# Patient Record
Sex: Male | Born: 1935
Health system: Southern US, Community
[De-identification: ages and names within clinical notes are randomized; demographics above are authoritative.]

## PROBLEM LIST (undated history)

## (undated) DIAGNOSIS — N189 Chronic kidney disease, unspecified: Secondary | ICD-10-CM

## (undated) DIAGNOSIS — C801 Malignant (primary) neoplasm, unspecified: Secondary | ICD-10-CM

## (undated) DIAGNOSIS — M1990 Unspecified osteoarthritis, unspecified site: Secondary | ICD-10-CM

## (undated) HISTORY — DX: Malignant (primary) neoplasm, unspecified: C80.1

## (undated) HISTORY — PX: PROSTATE SURGERY: SHX751

## (undated) HISTORY — DX: Chronic kidney disease, unspecified: N18.9

## (undated) HISTORY — PX: CRANIECTOMY FOR EXCISION OF ACOUSTIC NEUROMA: SUR324

## (undated) HISTORY — DX: Unspecified osteoarthritis, unspecified site: M19.90

---

## 2009-06-11 ENCOUNTER — Emergency Department (HOSPITAL_COMMUNITY): Admission: EM | Admit: 2009-06-11 | Discharge: 2009-06-12 | Payer: Self-pay | Admitting: Emergency Medicine

## 2010-10-20 ENCOUNTER — Ambulatory Visit: Admission: RE | Admit: 2010-10-20 | Payer: Self-pay | Source: Home / Self Care | Admitting: General Surgery

## 2011-06-07 ENCOUNTER — Ambulatory Visit (INDEPENDENT_AMBULATORY_CARE_PROVIDER_SITE_OTHER): Payer: Medicare Other | Admitting: Internal Medicine

## 2011-06-07 ENCOUNTER — Encounter: Payer: Self-pay | Admitting: Internal Medicine

## 2011-06-07 VITALS — BP 130/84 | HR 90 | Temp 98.3°F | Resp 18 | Ht 73.5 in | Wt 218.0 lb

## 2011-06-07 DIAGNOSIS — G479 Sleep disorder, unspecified: Secondary | ICD-10-CM

## 2011-06-07 DIAGNOSIS — Z0000 Encounter for general adult medical examination without abnormal findings: Secondary | ICD-10-CM

## 2011-06-07 DIAGNOSIS — Z125 Encounter for screening for malignant neoplasm of prostate: Secondary | ICD-10-CM

## 2011-06-07 DIAGNOSIS — C61 Malignant neoplasm of prostate: Secondary | ICD-10-CM

## 2011-06-07 DIAGNOSIS — Z79899 Other long term (current) drug therapy: Secondary | ICD-10-CM

## 2011-06-07 DIAGNOSIS — K219 Gastro-esophageal reflux disease without esophagitis: Secondary | ICD-10-CM

## 2011-06-07 DIAGNOSIS — Z860100 Personal history of colon polyps, unspecified: Secondary | ICD-10-CM | POA: Insufficient documentation

## 2011-06-07 DIAGNOSIS — Z23 Encounter for immunization: Secondary | ICD-10-CM

## 2011-06-07 LAB — CBC WITH DIFFERENTIAL/PLATELET
Hemoglobin: 14.3 g/dL (ref 13.0–17.0)
Lymphs Abs: 2 10*3/uL (ref 0.7–4.0)
MCHC: 33.1 g/dL (ref 30.0–36.0)
Monocytes Absolute: 0.7 10*3/uL (ref 0.1–1.0)
Monocytes Relative: 7.9 % (ref 3.0–12.0)
RDW: 13.6 % (ref 11.5–14.6)
WBC: 8.6 10*3/uL (ref 4.5–10.5)

## 2011-06-07 LAB — TSH: TSH: 2.91 u[IU]/mL (ref 0.35–5.50)

## 2011-06-07 LAB — COMPREHENSIVE METABOLIC PANEL
AST: 18 U/L (ref 0–37)
Albumin: 4.1 g/dL (ref 3.5–5.2)
Alkaline Phosphatase: 54 U/L (ref 39–117)
CO2: 28 mEq/L (ref 19–32)
Calcium: 10 mg/dL (ref 8.4–10.5)
Creatinine, Ser: 1.2 mg/dL (ref 0.4–1.5)
GFR: 62.46 mL/min (ref >60.00)
Glucose, Bld: 85 mg/dL (ref 70–99)
Total Bilirubin: 0.6 mg/dL (ref 0.3–1.2)

## 2011-06-07 LAB — CHOLESTEROL, TOTAL: Cholesterol: 247 mg/dL — ABNORMAL HIGH (ref 0–200)

## 2011-06-07 LAB — PSA: PSA: 0.05 ng/mL — ABNORMAL LOW (ref 0.10–4.00)

## 2011-06-07 MED ORDER — ESZOPICLONE 3 MG PO TABS: 3.0000 mg | ORAL_TABLET | Freq: Every day | ORAL | Status: DC
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
## 2011-06-07 NOTE — Patient Instructions (Signed)
Limit your sodium (Salt) intake    It is important that you exercise regularly, at least 20 minutes 3 to 4 times per week.  If you develop chest pain or shortness of breath seek  medical attention.  Return in one year for follow-up   

## 2011-10-10 ENCOUNTER — Ambulatory Visit (INDEPENDENT_AMBULATORY_CARE_PROVIDER_SITE_OTHER): Payer: Medicare Other | Admitting: Internal Medicine

## 2011-10-10 ENCOUNTER — Encounter: Payer: Self-pay | Admitting: Internal Medicine

## 2011-10-10 DIAGNOSIS — C61 Malignant neoplasm of prostate: Secondary | ICD-10-CM

## 2011-10-10 DIAGNOSIS — M7071 Other bursitis of hip, right hip: Secondary | ICD-10-CM

## 2011-10-10 DIAGNOSIS — M76899 Other specified enthesopathies of unspecified lower limb, excluding foot: Secondary | ICD-10-CM

## 2011-10-10 MED ORDER — METHYLPREDNISOLONE ACETATE 80 MG/ML IJ SUSP
80.0000 mg | Freq: Once | INTRAMUSCULAR | Status: AC
  Administered 2011-10-10: 80 mg via INTRAMUSCULAR

## 2011-10-10 NOTE — Progress Notes (Signed)
  Subjective:    [name], male    DOB: December 15, 1928, 78 y.o.   MRN: [id number]  HPI  78 year old patient who presents today with a chief complaint of right hip pain this has been present for approximately 6 weeks pain is bothersome at night but remains asymptomatic during the day even with activities. He usually walks 40 minutes a day and this causes no hip discomfort. He states he has had some urinary frequency since a prostatectomy but this has worsened recently also interfered with sleep. He does have some chronic insomnia issues and takes Lunesta once or twice weekly    Review of Systems  Constitutional: Negative for fever, chills, appetite change and fatigue.  HENT: Negative for hearing loss, ear pain, congestion, sore throat, trouble swallowing, neck stiffness, dental problem, voice change and tinnitus.   Eyes: Negative for pain, discharge and visual disturbance.  Respiratory: Negative for cough, chest tightness, wheezing and stridor.   Cardiovascular: Negative for chest pain, palpitations and leg swelling.  Gastrointestinal: Negative for nausea, vomiting, abdominal pain, diarrhea, constipation, blood in stool and abdominal distention.  Genitourinary: Positive for frequency. Negative for urgency, hematuria, flank pain, discharge, difficulty urinating and genital sores.  Musculoskeletal: Positive for arthralgias. Negative for myalgias, back pain, joint swelling and gait problem.  Skin: Negative for rash.  Neurological: Negative for dizziness, syncope, speech difficulty, weakness, numbness and headaches.  Hematological: Negative for adenopathy. Does not bruise/bleed easily.  Psychiatric/Behavioral: Negative for behavioral problems and dysphoric mood. The patient is not nervous/anxious.        Objective:   Physical Exam  Constitutional: He is oriented to person, place, and time. He appears well-developed.  HENT:  Head: Normocephalic.  Right Ear: External ear normal.  Left  Ear: External ear normal.  Eyes: Conjunctivae and EOM are normal.  Neck: Normal range of motion.  Cardiovascular: Normal rate and normal heart sounds.   Pulmonary/Chest: Breath sounds normal.  Abdominal: Bowel sounds are normal.  Musculoskeletal: Normal range of motion. He exhibits no edema and no tenderness.       No point tenderness over the right hip area range of motion right hip appear to be fairly well intact but  internal and external rotation did tend to reproduce his pain slightly  Neurological: He is alert and oriented to person, place, and time.  Psychiatric: He has a normal mood and affect. His behavior is normal.          Assessment & Plan:   Right hip pain favor a right hip bursitis. The patient has used Celebrex ibuprofen and naproxen with out much benefit we'll give an injection of Depo-Medrol to the right buttock area. He is also told to take 2 Tylenol at bedtime Urinary frequency. He'll moderate his caffeine and fluid intake especially after his evening meal  Return here when necessary if unimproved

## 2011-10-10 NOTE — Progress Notes (Signed)
Addended by: Duard Brady I on: 10/10/2011 11:13 AM   Modules accepted: Orders

## 2011-10-10 NOTE — Patient Instructions (Signed)
Take 2 Tylenol at bedtime Restrict your fluid intake following your evening meal Avoid caffeinated products  Call if not improved

## 2012-02-04 ENCOUNTER — Ambulatory Visit (INDEPENDENT_AMBULATORY_CARE_PROVIDER_SITE_OTHER): Payer: Medicare Other | Admitting: Internal Medicine

## 2012-02-04 ENCOUNTER — Encounter: Payer: Self-pay | Admitting: Internal Medicine

## 2012-02-04 VITALS — BP 120/80 | Temp 98.2°F | Wt 211.0 lb

## 2012-02-04 DIAGNOSIS — M25559 Pain in unspecified hip: Secondary | ICD-10-CM

## 2012-02-04 DIAGNOSIS — M25551 Pain in right hip: Secondary | ICD-10-CM

## 2012-02-04 NOTE — Patient Instructions (Signed)
Orthopedic followup as discussed  Continue Celebrex daily  Call or return to clinic prn if these symptoms worsen or fail to improve as anticipated.

## 2012-02-04 NOTE — Progress Notes (Signed)
  Subjective:    [name], male    DOB: [birth year], 78 y.o.   MRN: [id number]  HPI  78 year old patient who presents today with a chief complaint of persistent right hip pain. He was seen for this in February and at that time gave a six-week history of pain in spite of anti-inflammatory drug use. He was treated with Depo-Medrol was given and modest and temporary relief. He continues to take Celebrex but has persistent nightly pain. He states he has pain in the right hip area against about 15-20 minutes after retiring for the evening. This occurs whether he lies on his left side or his right side. During the day he has no pain with walking or other activities.    Review of Systems  Musculoskeletal:       Right hip pain       Objective:   Physical Exam  Constitutional: He appears well-developed and well-nourished. No distress.  Musculoskeletal:       Full range of motion of the right hip No localized tenderness about the right hip  No pain with the movement          Assessment & Plan:   Right hip pain. Will treat with Depo-Medrol 80 this gave the patient partial and temporary benefit 4 months ago. Will schedule orthopedic evaluation

## 2012-05-28 ENCOUNTER — Other Ambulatory Visit: Payer: Self-pay

## 2012-05-28 MED ORDER — ESZOPICLONE 3 MG PO TABS: 3.0000 mg | ORAL_TABLET | Freq: Every day | ORAL | Status: DC

## 2012-07-11 ENCOUNTER — Other Ambulatory Visit: Payer: Self-pay | Admitting: *Deleted

## 2012-10-21 ENCOUNTER — Ambulatory Visit (INDEPENDENT_AMBULATORY_CARE_PROVIDER_SITE_OTHER): Payer: Medicare Other | Admitting: Internal Medicine

## 2012-10-21 ENCOUNTER — Encounter: Payer: Self-pay | Admitting: Internal Medicine

## 2012-10-21 VITALS — BP 130/80 | HR 81 | Temp 97.6°F | Resp 18 | Wt 214.0 lb

## 2012-10-21 DIAGNOSIS — M25561 Pain in right knee: Secondary | ICD-10-CM

## 2012-10-21 DIAGNOSIS — M25551 Pain in right hip: Secondary | ICD-10-CM

## 2012-10-21 DIAGNOSIS — M25559 Pain in unspecified hip: Secondary | ICD-10-CM

## 2012-10-21 DIAGNOSIS — M25569 Pain in unspecified knee: Secondary | ICD-10-CM

## 2012-10-21 MED ORDER — ESZOPICLONE 2 MG PO TABS: 2.0000 mg | ORAL_TABLET | Freq: Every day | ORAL | Status: DC

## 2012-10-21 MED ORDER — METHYLPREDNISOLONE ACETATE 80 MG/ML IJ SUSP
80.0000 mg | Freq: Once | INTRAMUSCULAR | Status: AC
  Administered 2012-10-21: 80 mg via INTRAMUSCULAR

## 2012-10-21 NOTE — Patient Instructions (Addendum)
Call or return to clinic prn if these symptoms worsen or fail to improve as anticipated.

## 2012-10-21 NOTE — Progress Notes (Signed)
  Subjective:    [name], male    DOB: November 16, 1928, 79 y.o.   MRN: [id number]  HPI  79 year old patient who is seen today with a chief complaint of right hip and right knee pain. He has had flares intermittently and has responded quite nicely to a cortisone injections which he has received in frequently. He states he easily gets months of benefit. He has been seen by orthopedics with radiologic evaluation.  Past Medical History  Diagnosis Date  . Arthritis   . Cancer   . Chronic kidney disease     stones    History   Social History  . Marital Status: Married    Spouse Name: N/A    Number of Children: N/A  . Years of Education: N/A   Occupational History  . Not on file.   Social History Main Topics  . Smoking status: Former Games developer  . Smokeless tobacco: Never Used  . Alcohol Use: Yes  . Drug Use: No  . Sexually Active: Not on file   Other Topics Concern  . Not on file   Social History Narrative  . No narrative on file    Past Surgical History  Procedure Laterality Date  . Prostate surgery    . Craniectomy for excision of acoustic neuroma      Family History  Problem Relation Age of Onset  . Arthritis Father     Allergies  Allergen Reactions  . Penicillins Itching    Current Outpatient Prescriptions on File Prior to Visit  Medication Sig Dispense Refill  . Multiple Vitamin (MULTIVITAMIN) tablet Take 1 tablet by mouth daily.        Marland Kitchen omeprazole (PRILOSEC OTC) 20 MG tablet Take 20 mg by mouth daily as needed.        No current facility-administered medications on file prior to visit.    BP 130/80  Pulse 81  Temp(Src) 97.6 F (36.4 C) (Oral)  Resp 18  Wt 214 lb (97.07 kg)  BMI 27.85 kg/m2  SpO2 97%       Review of Systems  Musculoskeletal: Positive for arthralgias and gait problem.       Objective:   Physical Exam  Constitutional: He appears well-developed and well-nourished. No distress.  Musculoskeletal:  Negative straight  leg test Of active arthritis right knee Full range of motion right hip          Assessment & Plan:   Right hip and knee pain. The patient does benefit from Celebrex as well as prolonged benefit from cortisone injection. Will treat with Depo-Medrol 80 and if needed will resume Celebrex. He will call if unimproved

## 2013-03-12 ENCOUNTER — Ambulatory Visit (INDEPENDENT_AMBULATORY_CARE_PROVIDER_SITE_OTHER): Payer: Medicare Other | Admitting: Internal Medicine

## 2013-03-12 ENCOUNTER — Encounter: Payer: Self-pay | Admitting: Internal Medicine

## 2013-03-12 VITALS — BP 150/90 | HR 71 | Temp 97.9°F | Resp 20 | Ht 73.0 in | Wt 206.0 lb

## 2013-03-12 DIAGNOSIS — Z0000 Encounter for general adult medical examination without abnormal findings: Secondary | ICD-10-CM

## 2013-03-12 DIAGNOSIS — K219 Gastro-esophageal reflux disease without esophagitis: Secondary | ICD-10-CM

## 2013-03-12 DIAGNOSIS — E785 Hyperlipidemia, unspecified: Secondary | ICD-10-CM

## 2013-03-12 DIAGNOSIS — Z860100 Personal history of colon polyps, unspecified: Secondary | ICD-10-CM

## 2013-03-12 DIAGNOSIS — Z23 Encounter for immunization: Secondary | ICD-10-CM

## 2013-03-12 DIAGNOSIS — C61 Malignant neoplasm of prostate: Secondary | ICD-10-CM

## 2013-03-12 LAB — CBC WITH DIFFERENTIAL/PLATELET
Basophils Absolute: 0 10*3/uL (ref 0.0–0.1)
HCT: 43.4 % (ref 39.0–52.0)
Hemoglobin: 14.5 g/dL (ref 13.0–17.0)
MCHC: 33.5 g/dL (ref 30.0–36.0)
MCV: 93.2 fl (ref 78.0–100.0)
Monocytes Absolute: 0.5 10*3/uL (ref 0.1–1.0)
Monocytes Relative: 7.8 % (ref 3.0–12.0)
Neutro Abs: 3.7 10*3/uL (ref 1.4–7.7)
Neutrophils Relative %: 59.2 % (ref 43.0–77.0)
RBC: 4.66 Mil/uL (ref 4.22–5.81)

## 2013-03-12 LAB — COMPREHENSIVE METABOLIC PANEL
AST: 18 U/L (ref 0–37)
Alkaline Phosphatase: 60 U/L (ref 39–117)
BUN: 15 mg/dL (ref 6–23)
Calcium: 9.9 mg/dL (ref 8.4–10.5)
Chloride: 107 mEq/L (ref 96–112)
GFR: 65.34 mL/min (ref >60.00)

## 2013-03-12 NOTE — Patient Instructions (Addendum)
Limit your sodium (Salt) intake    It is important that you exercise regularly, at least 20 minutes 3 to 4 times per week.  If you develop chest pain or shortness of breath seek  medical attention.  Return in one year for follow-up   
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
## 2013-03-13 ENCOUNTER — Encounter: Payer: Self-pay | Admitting: Internal Medicine

## 2013-03-13 ENCOUNTER — Ambulatory Visit (INDEPENDENT_AMBULATORY_CARE_PROVIDER_SITE_OTHER): Payer: Medicare Other | Admitting: Internal Medicine

## 2013-03-13 VITALS — BP 110/78 | Temp 99.2°F | Wt 206.0 lb

## 2013-03-13 DIAGNOSIS — M25512 Pain in left shoulder: Secondary | ICD-10-CM

## 2013-03-13 DIAGNOSIS — M25519 Pain in unspecified shoulder: Secondary | ICD-10-CM

## 2013-03-13 MED ORDER — TRAMADOL HCL 50 MG PO TABS: 50.0000 mg | ORAL_TABLET | Freq: Three times a day (TID) | ORAL | Status: DC | PRN

## 2013-03-13 NOTE — Patient Instructions (Signed)
Aleve 2 tablets twice daily  You  may move around, but avoid painful motions and activities.  Apply ice to the sore area for 15 to 20 minutes 3 or 4 times daily for the next two to 3 days.

## 2013-03-13 NOTE — Progress Notes (Signed)
  Subjective:    [name], male    DOB: 1929/08/03, 79 y.o.   MRN: [id number]  HPI   79 year old patient who is seen today with a chief complaint of left shoulder pain. He was seen yesterday for a wellness exam and received a Pneumovax to the left deltoid area. He has developed subsequent pain that was quite uncomfortable through the night. Pain is aggravated by movement of the left shoulder. He states pain is better today compared to yesterday. No fever or constitutional complaints  Past Medical History  Diagnosis Date  . Arthritis   . Cancer   . Chronic kidney disease     stones    History   Social History  . Marital Status: Married    Spouse Name: N/A    Number of Children: N/A  . Years of Education: N/A   Occupational History  . Not on file.   Social History Main Topics  . Smoking status: Former Games developer  . Smokeless tobacco: Never Used  . Alcohol Use: Yes  . Drug Use: No  . Sexually Active: Not on file   Other Topics Concern  . Not on file   Social History Narrative  . No narrative on file    Past Surgical History  Procedure Laterality Date  . Prostate surgery    . Craniectomy for excision of acoustic neuroma      Family History  Problem Relation Age of Onset  . Arthritis Father     Allergies  Allergen Reactions  . Penicillins Itching    Current Outpatient Prescriptions on File Prior to Visit  Medication Sig Dispense Refill  . eszopiclone (LUNESTA) 2 MG TABS Take 1 tablet (2 mg total) by mouth at bedtime. Take immediately before bedtime  30 tablet  4  . Multiple Vitamin (MULTIVITAMIN) tablet Take 1 tablet by mouth daily.        Marland Kitchen omeprazole (PRILOSEC OTC) 20 MG tablet Take 20 mg by mouth daily as needed.        No current facility-administered medications on file prior to visit.    BP 110/78  Temp(Src) 99.2 F (37.3 C) (Oral)  Wt 206 lb (93.441 kg)  BMI 27.18 kg/m2  SpO2 97%       Review of Systems  Musculoskeletal:   Left shoulder pain       Objective:   Physical Exam  Constitutional: He appears well-developed and well-nourished. No distress.  Musculoskeletal:  Tenderness over the left posterolateral shoulder. No excessive warmth or soft tissue swelling. No rash or ecchymoses          Assessment & Plan:   Left shoulder pain following Pneumovax. Unclear whether this is mechanical versus local reaction to vaccine. We'll treat symptomatically with analgesics ice and rest. He states he is better today compared to yesterday. He'll call if there is not prompt clinical improvement

## 2013-07-02 ENCOUNTER — Other Ambulatory Visit: Payer: Self-pay

## 2013-11-06 ENCOUNTER — Ambulatory Visit (INDEPENDENT_AMBULATORY_CARE_PROVIDER_SITE_OTHER): Payer: Medicare HMO | Admitting: Family Medicine

## 2013-11-06 ENCOUNTER — Encounter: Payer: Self-pay | Admitting: Family Medicine

## 2013-11-06 VITALS — BP 130/70 | HR 79 | Temp 98.6°F | Ht 73.0 in | Wt 214.0 lb

## 2013-11-06 DIAGNOSIS — L723 Sebaceous cyst: Secondary | ICD-10-CM

## 2013-11-06 DIAGNOSIS — L729 Follicular cyst of the skin and subcutaneous tissue, unspecified: Secondary | ICD-10-CM

## 2013-11-06 MED ORDER — DOXYCYCLINE HYCLATE 100 MG PO TABS: 100.0000 mg | ORAL_TABLET | Freq: Two times a day (BID) | ORAL | Status: DC

## 2013-11-06 NOTE — Progress Notes (Signed)
Chief Complaint  Patient presents with  . Mass    Protruding from left abdomen.  First noticed yesterday.    HPI:  Acute issue ? Boil: -noticed yesterday -thinks it could have been there before -noticed knot under belt, no pain -denies: fevers, malaise, chills -hx of similar lesion in the past in another area of abd  ROS: See pertinent positives and negatives per HPI.  Past Medical History  Diagnosis Date  . Arthritis   . Cancer   . Chronic kidney disease     stones    Past Surgical History  Procedure Laterality Date  . Prostate surgery    . Craniectomy for excision of acoustic neuroma      Family History  Problem Relation Age of Onset  . Arthritis Father     History   Social History  . Marital Status: Married    Spouse Name: N/A    Number of Children: N/A  . Years of Education: N/A   Social History Main Topics  . Smoking status: Former Research scientist (life sciences)  . Smokeless tobacco: Never Used  . Alcohol Use: Yes  . Drug Use: No  . Sexual Activity: None   Other Topics Concern  . None   Social History Narrative  . None    Current outpatient prescriptions:eszopiclone (LUNESTA) 2 MG TABS, Take 1 tablet (2 mg total) by mouth at bedtime. Take immediately before bedtime, Disp: 30 tablet, Rfl: 4;  Multiple Vitamin (MULTIVITAMIN) tablet, Take 1 tablet by mouth daily.  , Disp: , Rfl: ;  omeprazole (PRILOSEC OTC) 20 MG tablet, Take 20 mg by mouth daily as needed. , Disp: , Rfl:  doxycycline (VIBRA-TABS) 100 MG tablet, Take 1 tablet (100 mg total) by mouth 2 (two) times daily., Disp: 20 tablet, Rfl: 0;  traMADol (ULTRAM) 50 MG tablet, Take 1 tablet (50 mg total) by mouth every 8 (eight) hours as needed for pain., Disp: 30 tablet, Rfl: 0  EXAM:  Filed Vitals:   11/06/13 1420  BP: 130/70  Pulse: 79  Temp: 98.6 F (37 C)    Body mass index is 28.24 kg/(m^2).  GENERAL: vitals reviewed and listed above, alert, oriented, appears well hydrated and in no acute distress  HEENT:  atraumatic, conjunttiva clear, no obvious abnormalities on inspection of external nose and ears  NECK: no obvious masses on inspection  SKIN: small boil vs cyst L lower abdomen with small amount of surrounding erythema, very small pustule with small amount of white material expressed and sent for culture with no other fluctuance appreciated.  MS: moves all extremities without noticeable abnormality  PSYCH: pleasant and cooperative, no obvious depression or anxiety  ASSESSMENT AND PLAN:  Discussed the following assessment and plan:  Skin cyst - Plan: doxycycline (VIBRA-TABS) 100 MG tablet  -inflamed cyst versus boil with possible mild cellulitis -draining on own without area of fluctuance or abscess for I and D - did send culture of expressed material  -doxy and return, emergent precautions and wound care discussed -follow up regardless on Monday or Tuesday  -Patient advised to return or notify a doctor immediately if symptoms worsen or persist or new concerns arise.  There are no Patient Instructions on file for this visit.   [name].  Pre visit review using our clinic review tool, if applicable. No additional management support is needed unless otherwise documented below in the visit note.

## 2013-11-06 NOTE — Patient Instructions (Signed)
-  warm compresses several times per day  -start and complete abx  -if increasing or spreading of redness, worsening, fevers, feeling sick see a doctor immediately  -follow up 3 days for recheck

## 2013-11-09 LAB — WOUND CULTURE
GRAM STAIN: NONE SEEN
GRAM STAIN: NONE SEEN
Gram Stain: NONE SEEN
ORGANISM ID, BACTERIA: NO GROWTH

## 2013-11-13 ENCOUNTER — Encounter: Payer: Self-pay | Admitting: Internal Medicine

## 2013-11-13 ENCOUNTER — Ambulatory Visit (INDEPENDENT_AMBULATORY_CARE_PROVIDER_SITE_OTHER): Payer: Medicare HMO | Admitting: Internal Medicine

## 2013-11-13 VITALS — BP 150/88 | HR 85 | Temp 97.7°F | Resp 20 | Ht 73.0 in | Wt 213.0 lb

## 2013-11-13 DIAGNOSIS — L723 Sebaceous cyst: Secondary | ICD-10-CM

## 2013-11-13 MED ORDER — ESZOPICLONE 2 MG PO TABS: 2.0000 mg | ORAL_TABLET | Freq: Every day | ORAL | Status: DC

## 2013-11-13 NOTE — Patient Instructions (Signed)
Take your antibiotic as prescribed until ALL of it is gone, but stop if you develop a rash, swelling, or any side effects of the medication.  Contact our office as soon as possible if  there are side effects of the medication.  Epidermal Cyst An epidermal cyst is sometimes called a sebaceous cyst, epidermal inclusion cyst, or infundibular cyst. These cysts usually contain a substance that looks "pasty" or "cheesy" and may have a bad smell. This substance is a protein called keratin. Epidermal cysts are usually found on the face, neck, or trunk. They may also occur in the vaginal area or other parts of the genitalia of both men and women. Epidermal cysts are usually small, painless, slow-growing bumps or lumps that move freely under the skin. It is important not to try to pop them. This may cause an infection and lead to tenderness and swelling. CAUSES  Epidermal cysts may be caused by a deep penetrating injury to the skin or a plugged hair follicle, often associated with acne. SYMPTOMS  Epidermal cysts can become inflamed and cause:  Redness.  Tenderness.  Increased temperature of the skin over the bumps or lumps.  Grayish-white, bad smelling material that drains from the bump or lump. DIAGNOSIS  Epidermal cysts are easily diagnosed by your caregiver during an exam. Rarely, a tissue sample (biopsy) may be taken to rule out other conditions that may resemble epidermal cysts. TREATMENT   Epidermal cysts often get better and disappear on their own. They are rarely ever cancerous.  If a cyst becomes infected, it may become inflamed and tender. This may require opening and draining the cyst. Treatment with antibiotics may be necessary. When the infection is gone, the cyst may be removed with minor surgery.  Small, inflamed cysts can often be treated with antibiotics or by injecting steroid medicines.  Sometimes, epidermal cysts become large and bothersome. If this happens, surgical removal in  your caregiver's office may be necessary. HOME CARE INSTRUCTIONS  Only take over-the-counter or prescription medicines as directed by your caregiver.  Take your antibiotics as directed. Finish them even if you start to feel better. SEEK MEDICAL CARE IF:   Your cyst becomes tender, red, or swollen.  Your condition is not improving or is getting worse.  You have any other questions or concerns. MAKE SURE YOU:  Understand these instructions.  Will watch your condition.  Will get help right away if you are not doing well or get worse. Document Released: 07/14/2004 Document Revised: 11/05/2011 Document Reviewed: 02/19/2011 [facility] Patient Information 2014 [place], [place].

## 2013-11-13 NOTE — Progress Notes (Signed)
Pre-visit discussion using our clinic review tool. No additional management support is needed unless otherwise documented below in the visit note.  

## 2013-11-13 NOTE — Progress Notes (Signed)
   Subjective:    [name], male    DOB: April 27, 1929, 80 y.o.   MRN: [id number]  HPI 80 year old patient who is seen today in followup.  He was seen last week with a small slightly inflamed sebaceous cyst involving his left lower abdominal area over the belt line.  A urine culture was sterile.  He is completing doxycycline antibiotic therapy.  He still has some slight tenderness but he feels the lesion has much improved.  No fever or constitutional complaints.  No drainage.  Past Medical History  Diagnosis Date  . Arthritis   . Cancer   . Chronic kidney disease     stones    History   Social History  . Marital Status: Married    Spouse Name: N/A    Number of Children: N/A  . Years of Education: N/A   Occupational History  . Not on file.   Social History Main Topics  . Smoking status: Former Research scientist (life sciences)  . Smokeless tobacco: Never Used  . Alcohol Use: Yes  . Drug Use: No  . Sexual Activity: Not on file   Other Topics Concern  . Not on file   Social History Narrative  . No narrative on file    Past Surgical History  Procedure Laterality Date  . Prostate surgery    . Craniectomy for excision of acoustic neuroma      Family History  Problem Relation Age of Onset  . Arthritis Father     Allergies  Allergen Reactions  . Penicillins Itching    Current Outpatient Prescriptions on File Prior to Visit  Medication Sig Dispense Refill  . doxycycline (VIBRA-TABS) 100 MG tablet Take 1 tablet (100 mg total) by mouth 2 (two) times daily.  20 tablet  0  . Multiple Vitamin (MULTIVITAMIN) tablet Take 1 tablet by mouth daily.        Marland Kitchen omeprazole (PRILOSEC OTC) 20 MG tablet Take 20 mg by mouth daily as needed.       . traMADol (ULTRAM) 50 MG tablet Take 1 tablet (50 mg total) by mouth every 8 (eight) hours as needed for pain.  30 tablet  0   No current facility-administered medications on file prior to visit.    BP 150/88  Pulse 85  Temp(Src) 97.7 F (36.5 C)  (Oral)  Resp 20  Ht 6\' 1"  (1.854 m)  Wt 213 lb (96.616 kg)  BMI 28.11 kg/m2  SpO2 97%       Review of Systems  Skin: Positive for wound.       Objective:   Physical Exam  Constitutional: He appears well-developed and well-nourished. No distress.  Skin:  2 cm area of slight induration and erythema involving the left lower bowel area.  No fluctuance or drainage.  Very minimally tender          Assessment & Plan:   Slightly inflamed, and improving sebaceous cyst, left lower abdominal wall.  Complete antibiotic therapy.  Local wound care discussed.  He will call if there is worsening, swelling, redness, or pain.  May require I&D in the future

## 2014-04-01 ENCOUNTER — Encounter: Payer: Self-pay | Admitting: Internal Medicine

## 2014-04-01 ENCOUNTER — Ambulatory Visit (INDEPENDENT_AMBULATORY_CARE_PROVIDER_SITE_OTHER): Payer: Medicare HMO | Admitting: Internal Medicine

## 2014-04-01 VITALS — BP 130/82 | HR 67 | Temp 98.0°F | Resp 20 | Ht 73.0 in | Wt 212.0 lb

## 2014-04-01 DIAGNOSIS — M25552 Pain in left hip: Secondary | ICD-10-CM

## 2014-04-01 DIAGNOSIS — M76899 Other specified enthesopathies of unspecified lower limb, excluding foot: Secondary | ICD-10-CM

## 2014-04-01 DIAGNOSIS — M25559 Pain in unspecified hip: Secondary | ICD-10-CM

## 2014-04-01 DIAGNOSIS — M7072 Other bursitis of hip, left hip: Secondary | ICD-10-CM

## 2014-04-01 MED ORDER — METHYLPREDNISOLONE ACETATE 80 MG/ML IJ SUSP
80.0000 mg | Freq: Once | INTRAMUSCULAR | Status: AC
  Administered 2014-04-01: 80 mg via INTRAMUSCULAR

## 2014-04-01 MED ORDER — MELOXICAM 15 MG PO TABS: 15.0000 mg | ORAL_TABLET | Freq: Every day | ORAL | Status: DC

## 2014-04-01 NOTE — Patient Instructions (Signed)
Hip Bursitis Bursitis is a puffiness (swelling) and soreness of a fluid-filled sac (bursa). This sac covers and protects the joint. HOME CARE  Put ice on the injured area.  Put ice in a plastic bag.  Place a towel between your skin and the bag.  Leave the ice on for 15-20 minutes, 03-04 times a day.  Rest the painful joint as much as possible. Move your joint at least 4 times a day. When pain lessens, start normal, slow movements and normal activities.  Only take medicine as told by your doctor.  Use crutches as told.  Raise (elevate) your painful joint. Use pillows for propping your legs and hips.  Get a massage to lessen pain. GET HELP RIGHT AWAY IF:  Your pain increases or does not improve during treatment.  You have a fever.  You feel heat coming from the affected area.  You see redness and puffiness around the affected area.  You have any questions or concerns. MAKE SURE YOU:  Understand these instructions.  Will watch your condition.  Will get help right away if you are not well or get worse. Document Released: 09/15/2010 Document Revised: 11/05/2011 Document Reviewed: 09/15/2010 [employer] Patient Information 2015 [employer]. This information is not intended to replace advice given to you by your health care provider. Make sure you discuss any questions you have with your health care provider.   

## 2014-04-01 NOTE — Progress Notes (Signed)
Subjective:    [name], male    DOB: 1929/04/23, 80 y.o.   MRN: [id number]  HPI  80 year old patient who presents with a chief complaint of left hip pain.  The patient was involved in an unusual vigorous activities over the weekend and spent much time and unusual positions up in the attic.  The following day he noted pain in the left hip area.  Pain is alleviated by rest and aggravated by weightbearing.  No constitutional complaints.  He has had some similar right hip issues in the past  Past Medical History  Diagnosis Date  . Arthritis   . Cancer   . Chronic kidney disease     stones    History   Social History  . Marital Status: Married    Spouse Name: N/A    Number of Children: N/A  . Years of Education: N/A   Occupational History  . Not on file.   Social History Main Topics  . Smoking status: Former Research scientist (life sciences)  . Smokeless tobacco: Never Used  . Alcohol Use: Yes  . Drug Use: No  . Sexual Activity: Not on file   Other Topics Concern  . Not on file   Social History Narrative  . No narrative on file    Past Surgical History  Procedure Laterality Date  . Prostate surgery    . Craniectomy for excision of acoustic neuroma      Family History  Problem Relation Age of Onset  . Arthritis Father     Allergies  Allergen Reactions  . Penicillins Itching    Current Outpatient Prescriptions on File Prior to Visit  Medication Sig Dispense Refill  . eszopiclone (LUNESTA) 2 MG TABS tablet Take 1 tablet (2 mg total) by mouth at bedtime. Take immediately before bedtime  30 tablet  5  . Multiple Vitamin (MULTIVITAMIN) tablet Take 1 tablet by mouth daily.        Marland Kitchen omeprazole (PRILOSEC OTC) 20 MG tablet Take 20 mg by mouth daily as needed.        No current facility-administered medications on file prior to visit.    BP 130/82  Pulse 67  Temp(Src) 98 F (36.7 C) (Oral)  Resp 20  Ht 6\' 1"  (1.854 m)  Wt 212 lb (96.163 kg)  BMI 27.98 kg/m2  SpO2  98%     Review of Systems  Constitutional: Negative for fever, chills, appetite change and fatigue.  HENT: Negative for congestion, dental problem, ear pain, hearing loss, sore throat, tinnitus, trouble swallowing and voice change.   Eyes: Negative for pain, discharge and visual disturbance.  Respiratory: Negative for cough, chest tightness, wheezing and stridor.   Cardiovascular: Negative for chest pain, palpitations and leg swelling.  Gastrointestinal: Negative for nausea, vomiting, abdominal pain, diarrhea, constipation, blood in stool and abdominal distention.  Genitourinary: Negative for urgency, hematuria, flank pain, discharge, difficulty urinating and genital sores.  Musculoskeletal: Positive for arthralgias and gait problem. Negative for back pain, joint swelling, myalgias and neck stiffness.  Skin: Negative for rash.  Neurological: Negative for dizziness, syncope, speech difficulty, weakness, numbness and headaches.  Hematological: Negative for adenopathy. Does not bruise/bleed easily.  Psychiatric/Behavioral: Negative for behavioral problems and dysphoric mood. The patient is not nervous/anxious.        Objective:   Physical Exam  Constitutional: He appears well-developed and well-nourished. No distress.  Musculoskeletal:  Range of motion left hip appear to be intact, but was slightly uncomfortable.  No tenderness over  the greater trochanter area Negative straight leg test           Assessment & Plan:   Probable left hip bursitis.  Will treat with Mobic.  Was given an injection of Depo-Medrol.  We'll moderate his activities.  We'll call if unimproved.  Was given a prescription for Mobic, which has been helpful in the past

## 2014-04-01 NOTE — Progress Notes (Signed)
Pre visit review using our clinic review tool, if applicable. No additional management support is needed unless otherwise documented below in the visit note. 

## 2014-04-04 ENCOUNTER — Encounter: Payer: Self-pay | Admitting: Internal Medicine

## 2014-04-16 ENCOUNTER — Encounter: Payer: Self-pay | Admitting: Internal Medicine

## 2014-04-16 ENCOUNTER — Ambulatory Visit (INDEPENDENT_AMBULATORY_CARE_PROVIDER_SITE_OTHER): Payer: Medicare HMO | Admitting: Internal Medicine

## 2014-04-16 VITALS — BP 110/64 | HR 72 | Temp 98.3°F | Ht 73.0 in | Wt 208.0 lb

## 2014-04-16 DIAGNOSIS — M7062 Trochanteric bursitis, left hip: Secondary | ICD-10-CM

## 2014-04-16 DIAGNOSIS — M76899 Other specified enthesopathies of unspecified lower limb, excluding foot: Secondary | ICD-10-CM

## 2014-04-16 MED ORDER — TRAMADOL HCL 50 MG PO TABS: 50.0000 mg | ORAL_TABLET | Freq: Three times a day (TID) | ORAL | Status: DC | PRN

## 2014-04-16 NOTE — Progress Notes (Signed)
Pre visit review using our clinic review tool, if applicable. No additional management support is needed unless otherwise documented below in the visit note. 

## 2014-04-16 NOTE — Progress Notes (Signed)
   Subjective:    [name], male    DOB: 1929-08-25, 80 y.o.   MRN: [id number]  HPI  80 year old patient who is seen today in followup.  He continues to have persistent left lateral hip and thigh pain.  He was seen 2 weeks ago and felt to have a trochanteric bursitis.  He was treated with Depo-Medrol, and Mobic.  He states that he has improved, but still having some discomfort, especially at night.  He must now sleep on his right side due to the left hip discomfort.  Pain is aggravated by walking and alleviated by rest  Past Medical History  Diagnosis Date  . Arthritis   . Cancer   . Chronic kidney disease     stones    History   Social History  . Marital Status: Married    Spouse Name: N/A    Number of Children: N/A  . Years of Education: N/A   Occupational History  . Not on file.   Social History Main Topics  . Smoking status: Former Research scientist (life sciences)  . Smokeless tobacco: Never Used  . Alcohol Use: Yes  . Drug Use: No  . Sexual Activity: Not on file   Other Topics Concern  . Not on file   Social History Narrative  . No narrative on file    Past Surgical History  Procedure Laterality Date  . Prostate surgery    . Craniectomy for excision of acoustic neuroma      Family History  Problem Relation Age of Onset  . Arthritis Father     Allergies  Allergen Reactions  . Penicillins Itching    Current Outpatient Prescriptions on File Prior to Visit  Medication Sig Dispense Refill  . eszopiclone (LUNESTA) 2 MG TABS tablet Take 1 tablet (2 mg total) by mouth at bedtime. Take immediately before bedtime  30 tablet  5  . meloxicam (MOBIC) 15 MG tablet Take 1 tablet (15 mg total) by mouth daily.  30 tablet  0  . Multiple Vitamin (MULTIVITAMIN) tablet Take 1 tablet by mouth daily.        Marland Kitchen omeprazole (PRILOSEC OTC) 20 MG tablet Take 20 mg by mouth daily as needed.        No current facility-administered medications on file prior to visit.    BP 110/64  Pulse  72  Temp(Src) 98.3 F (36.8 C) (Oral)  Ht 6\' 1"  (1.854 m)  Wt 208 lb (94.348 kg)  BMI 27.45 kg/m2  SpO2 95%    Review of Systems  Musculoskeletal: Positive for arthralgias and gait problem.       Objective:   Physical Exam  Constitutional: He appears well-developed and well-nourished. No distress.  Musculoskeletal:  Slight tenderness over the left greater trochanteric area Straight leg test negative The pain with the hip flexion Mild tenderness with internal and external rotation of the hip          Assessment & Plan:   Probable left greater trochanteric bursitis.  We'll treat with a local injection of 2 cc 1% Xylocaine and 1 cc of 80 mg of Depo-Medrol. Stretching exercises dispensed Continue Mobic when necessary

## 2014-04-16 NOTE — Patient Instructions (Signed)
Trochanteric Bursitis You have hip pain due to trochanteric bursitis. Bursitis means that the sack near the outside of the hip is filled with fluid and inflamed. This sack is made up of protective soft tissue. The pain from trochanteric bursitis can be severe and keep you from sleep. It can radiate to the buttocks or down the outside of the thigh to the knee. The pain is almost always worse when rising from the seated or lying position and with walking. Pain can improve after you take a few steps. It happens more often in people with hip joint and lumbar spine problems, such as arthritis or previous surgery. Very rarely the trochanteric bursa can become infected, and antibiotics and/or surgery may be needed. Treatment often includes an injection of local anesthetic mixed with cortisone medicine. This medicine is injected into the area where it is most tender over the hip. Repeat injections may be necessary if the response to treatment is slow. You can apply ice packs over the tender area for 30 minutes every 2 hours for the next few days. Anti-inflammatory and/or narcotic pain medicine may also be helpful. Limit your activity for the next few days if the pain continues. See your caregiver in 5-10 days if you are not greatly improved.  SEEK IMMEDIATE MEDICAL CARE IF:  You develop severe pain, fever, or increased redness.  You have pain that radiates below the knee. EXERCISES STRETCHING EXERCISES - Trochanteric Bursitis  These exercises may help you when beginning to rehabilitate your injury. Your symptoms may resolve with or without further involvement from your physician, physical therapist, or athletic trainer. While completing these exercises, remember:   Restoring tissue flexibility helps normal motion to return to the joints. This allows healthier, less painful movement and activity.  An effective stretch should be held for at least 30 seconds.  A stretch should never be painful. You should only  feel a gentle lengthening or release in the stretched tissue. STRETCH - Iliotibial Band  On the floor or bed, lie on your side so your injured leg is on top. Bend your knee and grab your ankle.  Slowly bring your knee back so that your thigh is in line with your trunk. Keep your heel at your buttocks and gently arch your back so your head, shoulders and hips line up.  Slowly lower your leg so that your knee approaches the floor/bed until you feel a gentle stretch on the outside of your thigh. If you do not feel a stretch and your knee will not fall farther, place the heel of your opposite foot on top of your knee and pull your thigh down farther.  Hold this stretch for __________ seconds.  Repeat __________ times. Complete this exercise __________ times per day. STRETCH - Hamstrings, Supine   Lie on your back. Loop a belt or towel over the ball of your foot as shown.  Straighten your knee and slowly pull on the belt to raise your injured leg. Do not allow the knee to bend. Keep your opposite leg flat on the floor.  Raise the leg until you feel a gentle stretch behind your knee or thigh. Hold this position for __________ seconds.  Repeat __________ times. Complete this stretch __________ times per day. STRETCH - Quadriceps, Prone   Lie on your stomach on a firm surface, such as a bed or padded floor.  Bend your knee and grasp your ankle. If you are unable to reach your ankle or pant leg, use a belt   around your foot to lengthen your reach.  Gently pull your heel toward your buttocks. Your knee should not slide out to the side. You should feel a stretch in the front of your thigh and/or knee.  Hold this position for __________ seconds.  Repeat __________ times. Complete this stretch __________ times per day. STRETCHING - Hip Flexors, Lunge Half kneel with your knee on the floor and your opposite knee bent and directly over your ankle.  Keep good posture with your head over your  shoulders. Tighten your buttocks to point your tailbone downward; this will prevent your back from arching too much.  You should feel a gentle stretch in the front of your thigh and/or hip. If you do not feel any resistance, slightly slide your opposite foot forward and then slowly lunge forward so your knee once again lines up over your ankle. Be sure your tailbone remains pointed downward.  Hold this stretch for __________ seconds.  Repeat __________ times. Complete this stretch __________ times per day. STRETCH - Adductors, Lunge  While standing, spread your legs.  Lean away from your injured leg by bending your opposite knee. You may rest your hands on your thigh for balance.  You should feel a stretch in your inner thigh. Hold for __________ seconds.  Repeat __________ times. Complete this exercise __________ times per day. Document Released: 09/20/2004 Document Revised: 12/28/2013 Document Reviewed: 11/25/2008 [employer] Patient Information 2015 [employer]. This information is not intended to replace advice given to you by your health care provider. Make sure you discuss any questions you have with your health care provider.  

## 2014-05-06 ENCOUNTER — Ambulatory Visit (INDEPENDENT_AMBULATORY_CARE_PROVIDER_SITE_OTHER): Payer: Medicare HMO | Admitting: Family Medicine

## 2014-05-06 ENCOUNTER — Encounter: Payer: Self-pay | Admitting: Family Medicine

## 2014-05-06 VITALS — BP 122/82 | HR 91 | Temp 97.9°F | Ht 73.0 in | Wt 208.0 lb

## 2014-05-06 DIAGNOSIS — M25559 Pain in unspecified hip: Secondary | ICD-10-CM

## 2014-05-06 DIAGNOSIS — M25552 Pain in left hip: Secondary | ICD-10-CM

## 2014-05-06 MED ORDER — MELOXICAM 15 MG PO TABS: 15.0000 mg | ORAL_TABLET | Freq: Every day | ORAL | Status: DC

## 2014-05-06 NOTE — Progress Notes (Signed)
No chief complaint on file.   HPI:  L Hip Pain: -has seen PCP twice in the last 6 weeks with dx of trochanteric bursitis -reports had inj and doing exercise a few times per week and was doing better -then slept wrong and woke up this morning with pain again -pain is in L lateral hip and leg, sharp, mod-severe, constant - better with sitting, worse with certain movements and sleeping on this hip, mobic helps but told only to take 1 time per day -denies:  Fevers, chills, weakness, numbness  ROS: See pertinent positives and negatives per HPI.  Past Medical History  Diagnosis Date  . Arthritis   . Cancer   . Chronic kidney disease     stones    Past Surgical History  Procedure Laterality Date  . Prostate surgery    . Craniectomy for excision of acoustic neuroma      Family History  Problem Relation Age of Onset  . Arthritis Father     History   Social History  . Marital Status: Married    Spouse Name: N/A    Number of Children: N/A  . Years of Education: N/A   Social History Main Topics  . Smoking status: Former Research scientist (life sciences)  . Smokeless tobacco: Never Used  . Alcohol Use: Yes  . Drug Use: No  . Sexual Activity: None   Other Topics Concern  . None   Social History Narrative  . None    Current outpatient prescriptions:eszopiclone (LUNESTA) 2 MG TABS tablet, Take 1 tablet (2 mg total) by mouth at bedtime. Take immediately before bedtime, Disp: 30 tablet, Rfl: 5;  meloxicam (MOBIC) 15 MG tablet, Take 1 tablet (15 mg total) by mouth daily., Disp: 30 tablet, Rfl: 0;  Multiple Vitamin (MULTIVITAMIN) tablet, Take 1 tablet by mouth daily.  , Disp: , Rfl:  omeprazole (PRILOSEC OTC) 20 MG tablet, Take 20 mg by mouth daily as needed. , Disp: , Rfl: ;  traMADol (ULTRAM) 50 MG tablet, Take 1 tablet (50 mg total) by mouth every 8 (eight) hours as needed., Disp: 30 tablet, Rfl: 0  EXAM:  Filed Vitals:   05/06/14 0850  BP: 122/82  Pulse: 91  Temp: 97.9 F (36.6 C)    Body  mass index is 27.45 kg/(m^2).  GENERAL: vitals reviewed and listed above, alert, oriented, appears well hydrated and in no acute distress  HEENT: atraumatic, conjunttiva clear, no obvious abnormalities on inspection of external nose and ears  NECK: no obvious masses on inspection  CV: pedal pulse normal  MS/NEURO: -normal gait -on inspection no leg length descrepancy, erythema or skin changes on hip or leg -mild TTP over IT band and greater trochanter -normal strength, sensation to light touch and DTRs in bilat LEs -neg SLRT and CLRT, pain in IT band region with both FABER and FADIR  MS: moves all extremities without noticeable abnormality  PSYCH: pleasant and cooperative, no obvious depression or anxiety  ASSESSMENT AND PLAN:  Discussed the following assessment and plan:  Hip pain, left - Plan: meloxicam (MOBIC) 15 MG tablet  -from exam suspect IT band pathology at this point, discussed other potential etiologies - bursal area not that tender this morning and suspect improved s/p steroid inj and HEP -offered xray - he prefers to hold on this - wants refil on mobic -instead will resume exercise, heat, mobic, tylenol and heat -follow up with Dr. Raliegh Ip in 2-3 weeks -Patient advised to return or notify a doctor immediately if symptoms worsen  or persist or new concerns arise.  Patient Instructions  -heat 15 minutes twice daily  -for pain can use the mobic per instructions, tylenol 500-1000mg  up to twice daily, topical sports creams (capsaicin or menthol)  -continue home exercises 4 days per week  -follow up with Dr. Burnice Logan in 2-4 weeks     [name].

## 2014-05-06 NOTE — Patient Instructions (Signed)
-  heat 15 minutes twice daily  -for pain can use the mobic per instructions, tylenol 500-1000mg  up to twice daily, topical sports creams (capsaicin or menthol)  -continue home exercises 4 days per week  -follow up with Dr. Burnice Logan in 2-4 weeks

## 2014-05-06 NOTE — Progress Notes (Signed)
Pre visit review using our clinic review tool, if applicable. No additional management support is needed unless otherwise documented below in the visit note. 

## 2014-07-30 ENCOUNTER — Other Ambulatory Visit: Payer: Self-pay | Admitting: Family Medicine

## 2014-10-25 ENCOUNTER — Ambulatory Visit (INDEPENDENT_AMBULATORY_CARE_PROVIDER_SITE_OTHER): Payer: Medicare HMO | Admitting: Internal Medicine

## 2014-10-25 ENCOUNTER — Encounter: Payer: Self-pay | Admitting: Internal Medicine

## 2014-10-25 VITALS — BP 130/80 | HR 80 | Temp 98.0°F | Resp 20 | Ht 72.5 in | Wt 211.0 lb

## 2014-10-25 DIAGNOSIS — K2100 Gastro-esophageal reflux disease with esophagitis, without bleeding: Secondary | ICD-10-CM

## 2014-10-25 DIAGNOSIS — Z860100 Personal history of colon polyps, unspecified: Secondary | ICD-10-CM

## 2014-10-25 DIAGNOSIS — K219 Gastro-esophageal reflux disease without esophagitis: Secondary | ICD-10-CM

## 2014-10-25 DIAGNOSIS — Z23 Encounter for immunization: Secondary | ICD-10-CM

## 2014-10-25 DIAGNOSIS — E785 Hyperlipidemia, unspecified: Secondary | ICD-10-CM

## 2014-10-25 DIAGNOSIS — Z0000 Encounter for general adult medical examination without abnormal findings: Secondary | ICD-10-CM

## 2014-10-25 DIAGNOSIS — C61 Malignant neoplasm of prostate: Secondary | ICD-10-CM

## 2014-10-25 LAB — CBC WITH DIFFERENTIAL/PLATELET
BASOS ABS: 0 10*3/uL (ref 0.0–0.1)
Basophils Relative: 0.6 % (ref 0.0–3.0)
EOS PCT: 4.8 % (ref 0.0–5.0)
Eosinophils Absolute: 0.4 10*3/uL (ref 0.0–0.7)
HCT: 43.4 % (ref 39.0–52.0)
Hemoglobin: 14.6 g/dL (ref 13.0–17.0)
LYMPHS PCT: 30.8 % (ref 12.0–46.0)
Lymphs Abs: 2.3 10*3/uL (ref 0.7–4.0)
MCHC: 33.6 g/dL (ref 30.0–36.0)
MCV: 90.1 fl (ref 78.0–100.0)
MONOS PCT: 8.5 % (ref 3.0–12.0)
Monocytes Absolute: 0.6 10*3/uL (ref 0.1–1.0)
NEUTROS ABS: 4.1 10*3/uL (ref 1.4–7.7)
NEUTROS PCT: 55.3 % (ref 43.0–77.0)
Platelets: 298 10*3/uL (ref 150.0–400.0)
RBC: 4.82 Mil/uL (ref 4.22–5.81)
RDW: 14.1 % (ref 11.5–15.5)
WBC: 7.3 10*3/uL (ref 4.0–10.5)

## 2014-10-25 LAB — COMPREHENSIVE METABOLIC PANEL
ALK PHOS: 57 U/L (ref 39–117)
ALT: 13 U/L (ref 0–53)
AST: 15 U/L (ref 0–37)
Albumin: 4.1 g/dL (ref 3.5–5.2)
BILIRUBIN TOTAL: 0.8 mg/dL (ref 0.2–1.2)
BUN: 18 mg/dL (ref 6–23)
CO2: 29 mEq/L (ref 19–32)
Calcium: 9.9 mg/dL (ref 8.4–10.5)
Chloride: 106 mEq/L (ref 96–112)
Creatinine, Ser: 1.23 mg/dL (ref 0.40–1.50)
GFR: 59.62 mL/min — ABNORMAL LOW (ref >60.00)
GLUCOSE: 97 mg/dL (ref 70–99)
Potassium: 4.7 mEq/L (ref 3.5–5.1)
SODIUM: 142 meq/L (ref 135–145)
TOTAL PROTEIN: 6.8 g/dL (ref 6.0–8.3)

## 2014-10-25 LAB — LIPID PANEL
CHOLESTEROL: 232 mg/dL — AB (ref 0–200)
HDL: 61.3 mg/dL (ref >39.00)
LDL Cholesterol: 149 mg/dL — ABNORMAL HIGH (ref 0–99)
NONHDL: 170.7
TRIGLYCERIDES: 108 mg/dL (ref 0.0–149.0)
Total CHOL/HDL Ratio: 4
VLDL: 21.6 mg/dL (ref 0.0–40.0)

## 2014-10-25 LAB — PSA: PSA: 0.1 ng/mL (ref 0.10–4.00)

## 2014-10-25 LAB — TSH: TSH: 5.21 u[IU]/mL — AB (ref 0.35–4.50)

## 2014-10-25 MED ORDER — MELOXICAM 15 MG PO TABS: ORAL_TABLET | ORAL | Status: DC

## 2014-10-25 MED ORDER — ESZOPICLONE 2 MG PO TABS: 2.0000 mg | ORAL_TABLET | Freq: Every day | ORAL | Status: DC

## 2014-10-25 NOTE — Progress Notes (Signed)
Pre visit review using our clinic review tool, if applicable. No additional management support is needed unless otherwise documented below in the visit note. 
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
## 2014-10-25 NOTE — Patient Instructions (Signed)
Limit your sodium (Salt) intake    It is important that you exercise regularly, at least 20 minutes 3 to 4 times per week.  If you develop chest pain or shortness of breath seek  medical attention.  Avoids foods high in acid such as tomatoes citrus juices, and spicy foods.  Avoid eating within two hours of lying down or before exercising.  Do not overheat.  Try smaller more frequent meals.  If symptoms persist, elevate the head of her bed 12 inches while sleeping.  Health Maintenance A healthy lifestyle and preventative care can promote health and wellness.  Maintain regular health, dental, and eye exams.  Eat a healthy diet. Foods like vegetables, fruits, whole grains, low-fat dairy products, and lean protein foods contain the nutrients you need and are low in calories. Decrease your intake of foods high in solid fats, added sugars, and salt. Get information about a proper diet from your health care provider, if necessary.  Regular physical exercise is one of the most important things you can do for your health. Most adults should get at least 150 minutes of moderate-intensity exercise (any activity that increases your heart rate and causes you to sweat) each week. In addition, most adults need muscle-strengthening exercises on 2 or more days a week.   Maintain a healthy weight. The body mass index (BMI) is a screening tool to identify possible weight problems. It provides an estimate of body fat based on height and weight. Your health care provider can find your BMI and can help you achieve or maintain a healthy weight. For males 20 years and older:  A BMI below 18.5 is considered underweight.  A BMI of 18.5 to 24.9 is normal.  A BMI of 25 to 29.9 is considered overweight.  A BMI of 30 and above is considered obese.  Maintain normal blood lipids and cholesterol by exercising and minimizing your intake of saturated fat. Eat a balanced diet with plenty of fruits and vegetables. Blood tests  for lipids and cholesterol should begin at age 48 and be repeated every 5 years. If your lipid or cholesterol levels are high, you are over age 21, or you are at high risk for heart disease, you may need your cholesterol levels checked more frequently.Ongoing high lipid and cholesterol levels should be treated with medicines if diet and exercise are not working.  If you smoke, find out from your health care provider how to quit. If you do not use tobacco, do not start.  Lung cancer screening is recommended for adults aged 8-80 years who are at high risk for developing lung cancer because of a history of smoking. A yearly low-dose CT scan of the lungs is recommended for people who have at least a 30-pack-year history of smoking and are current smokers or have quit within the past 15 years. A pack year of smoking is smoking an average of 1 pack of cigarettes a day for 1 year (for example, a 30-pack-year history of smoking could mean smoking 1 pack a day for 30 years or 2 packs a day for 15 years). Yearly screening should continue until the smoker has stopped smoking for at least 15 years. Yearly screening should be stopped for people who develop a health problem that would prevent them from having lung cancer treatment.  If you choose to drink alcohol, do not have more than 2 drinks per day. One drink is considered to be 12 oz (360 mL) of beer, 5 oz (150 mL)  of wine, or 1.5 oz (45 mL) of liquor.  Avoid the use of street drugs. Do not share needles with anyone. Ask for help if you need support or instructions about stopping the use of drugs.  High blood pressure causes heart disease and increases the risk of stroke. Blood pressure should be checked at least every 1-2 years. Ongoing high blood pressure should be treated with medicines if weight loss and exercise are not effective.  If you are 61-21 years old, ask your health care provider if you should take aspirin to prevent heart disease.  Diabetes  screening involves taking a blood sample to check your fasting blood sugar level. This should be done once every 3 years after age 74 if you are at a normal weight and without risk factors for diabetes. Testing should be considered at a younger age or be carried out more frequently if you are overweight and have at least 1 risk factor for diabetes.  Colorectal cancer can be detected and often prevented. Most routine colorectal cancer screening begins at the age of 31 and continues through age 7. However, your health care provider may recommend screening at an earlier age if you have risk factors for colon cancer. On a yearly basis, your health care provider may provide home test kits to check for hidden blood in the stool. A small camera at the end of a tube may be used to directly examine the colon (sigmoidoscopy or colonoscopy) to detect the earliest forms of colorectal cancer. Talk to your health care provider about this at age 8 when routine screening begins. A direct exam of the colon should be repeated every 5-10 years through age 35, unless early forms of precancerous polyps or small growths are found.  People who are at an increased risk for hepatitis B should be screened for this virus. You are considered at high risk for hepatitis B if:  You were born in a country where hepatitis B occurs often. Talk with your health care provider about which countries are considered high risk.  Your parents were born in a high-risk country and you have not received a shot to protect against hepatitis B (hepatitis B vaccine).  You have HIV or AIDS.  You use needles to inject street drugs.  You live with, or have sex with, someone who has hepatitis B.  You are a man who has sex with other men (MSM).  You get hemodialysis treatment.  You take certain medicines for conditions like cancer, organ transplantation, and autoimmune conditions.  Hepatitis C blood testing is recommended for all people born  from [birth year] through 1965 and any individual with known risk factors for hepatitis C.  Healthy men should no longer receive prostate-specific antigen (PSA) blood tests as part of routine cancer screening. Talk to your health care provider about prostate cancer screening.  Testicular cancer screening is not recommended for adolescents or adult males who have no symptoms. Screening includes self-exam, a health care provider exam, and other screening tests. Consult with your health care provider about any symptoms you have or any concerns you have about testicular cancer.  Practice safe sex. Use condoms and avoid high-risk sexual practices to reduce the spread of sexually transmitted infections (STIs).  You should be screened for STIs, including gonorrhea and chlamydia if:  You are sexually active and are younger than 24 years.  You are older than 24 years, and your health care provider tells you that you are at risk for this type  of infection.  Your sexual activity has changed since you were last screened, and you are at an increased risk for chlamydia or gonorrhea. Ask your health care provider if you are at risk.  If you are at risk of being infected with HIV, it is recommended that you take a prescription medicine daily to prevent HIV infection. This is called pre-exposure prophylaxis (PrEP). You are considered at risk if:  You are a man who has sex with other men (MSM).  You are a heterosexual man who is sexually active with multiple partners.  You take drugs by injection.  You are sexually active with a partner who has HIV.  Talk with your health care provider about whether you are at high risk of being infected with HIV. If you choose to begin PrEP, you should first be tested for HIV. You should then be tested every 3 months for as long as you are taking PrEP.  Use sunscreen. Apply sunscreen liberally and repeatedly throughout the day. You should seek shade when your shadow is shorter  than you. Protect yourself by wearing long sleeves, pants, a wide-brimmed hat, and sunglasses year round whenever you are outdoors.  Tell your health care provider of new moles or changes in moles, especially if there is a change in shape or color. Also, tell your health care provider if a mole is larger than the size of a pencil eraser.  A one-time screening for abdominal aortic aneurysm (AAA) and surgical repair of large AAAs by ultrasound is recommended for men aged 26-75 years who are current or former smokers.  Stay current with your vaccines (immunizations). Document Released: 02/09/2008 Document Revised: 08/18/2013 Document Reviewed: 01/08/2011 [facility] Patient Information 2015 [place], [place]. This information is not intended to replace advice given to you by your health care provider. Make sure you discuss any questions you have with your health care provider.

## 2014-11-24 ENCOUNTER — Telehealth: Payer: Self-pay | Admitting: Internal Medicine

## 2014-11-24 NOTE — Telephone Encounter (Signed)
I received a PA request for eszopiclone 2 mg tablet.  The fax states medication is not covered on patient's formulary.  I have printed out a list of formularies from [facility] and put it on your desk.  Please review and advise if RX can be changed to a preferred alternative.

## 2014-11-25 MED ORDER — ZALEPLON 5 MG PO CAPS: 5.0000 mg | ORAL_CAPSULE | Freq: Every evening | ORAL | Status: DC | PRN

## 2014-11-25 NOTE — Telephone Encounter (Signed)
New Rx for Zaleplon 5 mg faxed to [facility] pharmacy.

## 2014-11-30 ENCOUNTER — Other Ambulatory Visit: Payer: Self-pay | Admitting: Family Medicine

## 2015-04-01 ENCOUNTER — Other Ambulatory Visit: Payer: Self-pay | Admitting: Internal Medicine

## 2015-04-01 MED ORDER — ESZOPICLONE 2 MG PO TABS: 2.0000 mg | ORAL_TABLET | Freq: Every evening | ORAL | Status: DC | PRN

## 2015-09-02 ENCOUNTER — Other Ambulatory Visit: Payer: Self-pay | Admitting: Internal Medicine

## 2015-09-08 DIAGNOSIS — L570 Actinic keratosis: Secondary | ICD-10-CM | POA: Diagnosis not present

## 2015-11-15 DIAGNOSIS — H5203 Hypermetropia, bilateral: Secondary | ICD-10-CM | POA: Diagnosis not present

## 2015-11-15 DIAGNOSIS — H25011 Cortical age-related cataract, right eye: Secondary | ICD-10-CM | POA: Diagnosis not present

## 2015-11-15 DIAGNOSIS — H2511 Age-related nuclear cataract, right eye: Secondary | ICD-10-CM | POA: Diagnosis not present

## 2016-01-18 ENCOUNTER — Ambulatory Visit (INDEPENDENT_AMBULATORY_CARE_PROVIDER_SITE_OTHER): Payer: PPO | Admitting: Internal Medicine

## 2016-01-18 ENCOUNTER — Encounter: Payer: Self-pay | Admitting: Internal Medicine

## 2016-01-18 VITALS — BP 158/90 | HR 74 | Temp 97.8°F | Resp 16 | Ht 72.5 in | Wt 219.0 lb

## 2016-01-18 DIAGNOSIS — R7989 Other specified abnormal findings of blood chemistry: Secondary | ICD-10-CM

## 2016-01-18 DIAGNOSIS — Z0000 Encounter for general adult medical examination without abnormal findings: Secondary | ICD-10-CM | POA: Diagnosis not present

## 2016-01-18 DIAGNOSIS — R946 Abnormal results of thyroid function studies: Secondary | ICD-10-CM

## 2016-01-18 DIAGNOSIS — Z86010 Personal history of colonic polyps: Secondary | ICD-10-CM | POA: Diagnosis not present

## 2016-01-18 DIAGNOSIS — C61 Malignant neoplasm of prostate: Secondary | ICD-10-CM

## 2016-01-18 DIAGNOSIS — K219 Gastro-esophageal reflux disease without esophagitis: Secondary | ICD-10-CM | POA: Diagnosis not present

## 2016-01-18 LAB — CBC WITH DIFFERENTIAL/PLATELET
BASOS PCT: 0.5 % (ref 0.0–3.0)
Basophils Absolute: 0 10*3/uL (ref 0.0–0.1)
EOS PCT: 3 % (ref 0.0–5.0)
Eosinophils Absolute: 0.2 10*3/uL (ref 0.0–0.7)
HEMATOCRIT: 42.2 % (ref 39.0–52.0)
HEMOGLOBIN: 14.3 g/dL (ref 13.0–17.0)
LYMPHS PCT: 25.9 % (ref 12.0–46.0)
Lymphs Abs: 2 10*3/uL (ref 0.7–4.0)
MCHC: 33.8 g/dL (ref 30.0–36.0)
MCV: 88.7 fl (ref 78.0–100.0)
Monocytes Absolute: 0.6 10*3/uL (ref 0.1–1.0)
Monocytes Relative: 7.9 % (ref 3.0–12.0)
NEUTROS ABS: 4.9 10*3/uL (ref 1.4–7.7)
Neutrophils Relative %: 62.7 % (ref 43.0–77.0)
Platelets: 305 10*3/uL (ref 150.0–400.0)
RBC: 4.75 Mil/uL (ref 4.22–5.81)
RDW: 14 % (ref 11.5–15.5)
WBC: 7.8 10*3/uL (ref 4.0–10.5)

## 2016-01-18 LAB — COMPREHENSIVE METABOLIC PANEL
ALBUMIN: 4.1 g/dL (ref 3.5–5.2)
ALT: 11 U/L (ref 0–53)
AST: 15 U/L (ref 0–37)
Alkaline Phosphatase: 57 U/L (ref 39–117)
BUN: 20 mg/dL (ref 6–23)
CALCIUM: 9.6 mg/dL (ref 8.4–10.5)
CHLORIDE: 107 meq/L (ref 96–112)
CO2: 29 meq/L (ref 19–32)
Creatinine, Ser: 1.18 mg/dL (ref 0.40–1.50)
GFR: 62.36 mL/min (ref >60.00)
Glucose, Bld: 89 mg/dL (ref 70–99)
POTASSIUM: 4.1 meq/L (ref 3.5–5.1)
SODIUM: 142 meq/L (ref 135–145)
Total Bilirubin: 0.7 mg/dL (ref 0.2–1.2)
Total Protein: 6.6 g/dL (ref 6.0–8.3)

## 2016-01-18 LAB — TSH: TSH: 5.59 u[IU]/mL — ABNORMAL HIGH (ref 0.35–4.50)
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
## 2016-01-18 NOTE — Progress Notes (Signed)
   Subjective:    [name], male    DOB: [birth year], 82 y.o.   MRN: [id number]  HPI  BP Readings from Last 3 Encounters:  01/18/16 158/90  10/25/14 130/80  05/06/14 122/82    Review of Systems     Objective:   Physical Exam        Assessment & Plan:

## 2016-01-18 NOTE — Patient Instructions (Signed)
Limit your sodium (Salt) intake    It is important that you exercise regularly, at least 20 minutes 3 to 4 times per week.  If you develop chest pain or shortness of breath seek  medical attention.  Please check your blood pressure on a regular basis.  If it is consistently greater than 150/90, please make an office appointment.   Return in 6 weeks for follow-up  DASH Eating Plan DASH stands for "Dietary Approaches to Stop Hypertension." The DASH eating plan is a healthy eating plan that has been shown to reduce high blood pressure (hypertension). Additional health benefits may include reducing the risk of type 2 diabetes mellitus, heart disease, and stroke. The DASH eating plan may also help with weight loss. WHAT DO I NEED TO KNOW ABOUT THE DASH EATING PLAN? For the DASH eating plan, you will follow these general guidelines:  Choose foods with a percent daily value for sodium of less than 5% (as listed on the food label).  Use salt-free seasonings or herbs instead of table salt or sea salt.  Check with your health care provider or pharmacist before using salt substitutes.  Eat lower-sodium products, often labeled as "lower sodium" or "no salt added."  Eat fresh foods.  Eat more vegetables, fruits, and low-fat dairy products.  Choose whole grains. Look for the word "whole" as the first word in the ingredient list.  Choose fish and skinless chicken or [place] more often than red meat. Limit fish, poultry, and meat to 6 oz (170 g) each day.  Limit sweets, desserts, sugars, and sugary drinks.  Choose heart-healthy fats.  Limit cheese to 1 oz (28 g) per day.  Eat more home-cooked food and less restaurant, buffet, and fast food.  Limit fried foods.  Cook foods using methods other than frying.  Limit canned vegetables. If you do use them, rinse them well to decrease the sodium.  When eating at a restaurant, ask that your food be prepared with less salt, or no salt if  possible. WHAT FOODS CAN I EAT? Seek help from a dietitian for individual calorie needs. Grains Whole grain or whole wheat bread. Brown rice. Whole grain or whole wheat pasta. Quinoa, bulgur, and whole grain cereals. Low-sodium cereals. Corn or whole wheat flour tortillas. Whole grain cornbread. Whole grain crackers. Low-sodium crackers. Vegetables Fresh or frozen vegetables (raw, steamed, roasted, or grilled). Low-sodium or reduced-sodium tomato and vegetable juices. Low-sodium or reduced-sodium tomato sauce and paste. Low-sodium or reduced-sodium canned vegetables.  Fruits All fresh, canned (in natural juice), or frozen fruits. Meat and Other Protein Products Ground beef (85% or leaner), grass-fed beef, or beef trimmed of fat. Skinless chicken or [place]. Ground chicken or [place]. Pork trimmed of fat. All fish and seafood. Eggs. Dried beans, peas, or lentils. Unsalted nuts and seeds. Unsalted canned beans. Dairy Low-fat dairy products, such as skim or 1% milk, 2% or reduced-fat cheeses, low-fat ricotta or cottage cheese, or plain low-fat yogurt. Low-sodium or reduced-sodium cheeses. Fats and Oils Tub margarines without trans fats. Light or reduced-fat mayonnaise and salad dressings (reduced sodium). Avocado. Safflower, olive, or canola oils. Natural peanut or almond butter. Other Unsalted popcorn and pretzels. The items listed above may not be a complete list of recommended foods or beverages. Contact your dietitian for more options. WHAT FOODS ARE NOT RECOMMENDED? Grains White bread. White pasta. White rice. Refined cornbread. Bagels and croissants. Crackers that contain trans fat. Vegetables Creamed or fried vegetables. Vegetables in a cheese sauce. Regular canned vegetables.  Regular canned tomato sauce and paste. Regular tomato and vegetable juices. Fruits Dried fruits. Canned fruit in light or heavy syrup. Fruit juice. Meat and Other Protein Products Fatty cuts of meat. Ribs, chicken  wings, bacon, sausage, bologna, salami, chitterlings, fatback, hot dogs, bratwurst, and packaged luncheon meats. Salted nuts and seeds. Canned beans with salt. Dairy Whole or 2% milk, cream, half-and-half, and cream cheese. Whole-fat or sweetened yogurt. Full-fat cheeses or blue cheese. Nondairy creamers and whipped toppings. Processed cheese, cheese spreads, or cheese curds. Condiments Onion and garlic salt, seasoned salt, table salt, and sea salt. Canned and packaged gravies. Worcestershire sauce. Tartar sauce. Barbecue sauce. Teriyaki sauce. Soy sauce, including reduced sodium. Steak sauce. Fish sauce. Oyster sauce. Cocktail sauce. Horseradish. Ketchup and mustard. Meat flavorings and tenderizers. Bouillon cubes. Hot sauce. Tabasco sauce. Marinades. Taco seasonings. Relishes. Fats and Oils Butter, stick margarine, lard, shortening, ghee, and bacon fat. Coconut, palm kernel, or palm oils. Regular salad dressings. Other Pickles and olives. Salted popcorn and pretzels. The items listed above may not be a complete list of foods and beverages to avoid. Contact your dietitian for more information. WHERE CAN I FIND MORE INFORMATION? National Heart, Lung, and Blood Institute: travelstabloid.com   This information is not intended to replace advice given to you by your health care provider. Make sure you discuss any questions you have with your health care provider.   Document Released: 08/02/2011 Document Revised: 09/03/2014 Document Reviewed: 06/17/2013 Elsevier Interactive Patient Education [employer].

## 2016-01-18 NOTE — Progress Notes (Signed)
Pre visit review using our clinic review tool, if applicable. No additional management support is needed unless otherwise documented below in the visit note. 

## 2016-01-20 ENCOUNTER — Other Ambulatory Visit: Payer: Self-pay | Admitting: *Deleted

## 2016-01-20 MED ORDER — LEVOTHYROXINE SODIUM 50 MCG PO TABS: 50.0000 ug | ORAL_TABLET | Freq: Every day | ORAL | Status: DC

## 2016-02-29 ENCOUNTER — Ambulatory Visit (INDEPENDENT_AMBULATORY_CARE_PROVIDER_SITE_OTHER): Payer: PPO | Admitting: Internal Medicine

## 2016-02-29 ENCOUNTER — Encounter: Payer: Self-pay | Admitting: Internal Medicine

## 2016-02-29 VITALS — BP 140/80 | HR 81 | Temp 98.4°F | Resp 20 | Ht 72.5 in | Wt 217.0 lb

## 2016-02-29 DIAGNOSIS — E039 Hypothyroidism, unspecified: Secondary | ICD-10-CM | POA: Diagnosis not present

## 2016-02-29 LAB — TSH: TSH: 3.15 u[IU]/mL (ref 0.35–4.50)

## 2016-02-29 MED ORDER — LEVOTHYROXINE SODIUM 50 MCG PO TABS: 50.0000 ug | ORAL_TABLET | Freq: Every day | ORAL | Status: DC

## 2016-02-29 NOTE — Progress Notes (Signed)
Pre visit review using our clinic review tool, if applicable. No additional management support is needed unless otherwise documented below in the visit note. 

## 2016-02-29 NOTE — Progress Notes (Signed)
Subjective:    [name], male    DOB: [birth year], 82 y.o.   MRN: [id number]  HPI  82 year old patient who is seen recently for a preventive health examination.  Blood pressure is borderline high and he was placed on a DASH diet plan.  Home blood pressure readings have been under excellent control with readings as low as 120 over 63 He generally feels well. He was noted have an elevated TSH last visit and now is on thyroid supplementation.  He feels well  Past Medical History  Diagnosis Date  . Arthritis   . Cancer ([place])   . Chronic kidney disease     stones     Social History   Social History  . Marital Status: Married    Spouse Name: N/A  . Number of Children: N/A  . Years of Education: N/A   Occupational History  . Not on file.   Social History Main Topics  . Smoking status: Former Research scientist (life sciences)  . Smokeless tobacco: Never Used  . Alcohol Use: Yes  . Drug Use: No  . Sexual Activity: Not on file   Other Topics Concern  . Not on file   Social History Narrative    Past Surgical History  Procedure Laterality Date  . Prostate surgery    . Craniectomy for excision of acoustic neuroma      Family History  Problem Relation Age of Onset  . Arthritis Father     Allergies  Allergen Reactions  . Penicillins Itching    Current Outpatient Prescriptions on File Prior to Visit  Medication Sig Dispense Refill  . eszopiclone (LUNESTA) 2 MG TABS tablet TAKE 1 TABLET BY MOUTH AT BEDTIME AS NEEDED FOR SLEEP 30 tablet 2  . Multiple Vitamin (MULTIVITAMIN) tablet Take 1 tablet by mouth daily.      . zaleplon (SONATA) 5 MG capsule Take 1 capsule (5 mg total) by mouth at bedtime as needed for sleep. 30 capsule 5   No current facility-administered medications on file prior to visit.    BP 140/80 mmHg  Pulse 81  Temp(Src) 98.4 F (36.9 C) (Oral)  Resp 20  Ht 6' 0.5" (1.842 m)  Wt 217 lb (98.431 kg)  BMI 29.01 kg/m2  SpO2 97%      Review of Systems    Constitutional: Negative for fever, chills, appetite change and fatigue.  HENT: Negative for congestion, dental problem, ear pain, hearing loss, sore throat, tinnitus, trouble swallowing and voice change.   Eyes: Negative for pain, discharge and visual disturbance.  Respiratory: Negative for cough, chest tightness, wheezing and stridor.   Cardiovascular: Negative for chest pain, palpitations and leg swelling.  Gastrointestinal: Negative for nausea, vomiting, abdominal pain, diarrhea, constipation, blood in stool and abdominal distention.  Genitourinary: Negative for urgency, hematuria, flank pain, discharge, difficulty urinating and genital sores.  Musculoskeletal: Negative for myalgias, back pain, joint swelling, arthralgias, gait problem and neck stiffness.  Skin: Negative for rash.  Neurological: Negative for dizziness, syncope, speech difficulty, weakness, numbness and headaches.  Hematological: Negative for adenopathy. Does not bruise/bleed easily.  Psychiatric/Behavioral: Negative for behavioral problems and dysphoric mood. The patient is not nervous/anxious.        Objective:   Physical Exam  Constitutional: He appears well-developed and well-nourished. No distress.  Blood pressure 150/90 Good correlation with his home blood pressure cuff          Assessment & Plan:   Hypothyroidism.  We'll check a TSH Hypertensive suspect.  Blood pressure readings at home have been well controlled.  We'll continue observation and Dash diet  Will report any consistent blood pressure readings above 150 over 90, otherwise return in one year for follow-up  Nyoka Cowden, MD

## 2016-02-29 NOTE — Patient Instructions (Signed)
Limit your sodium (Salt) intake  Please check your blood pressure on a regular basis.  If it is consistently greater than 150/90, please make an office appointment.    It is important that you exercise regularly, at least 20 minutes 3 to 4 times per week.  If you develop chest pain or shortness of breath seek  medical attention.  Return in one year for follow-up  

## 2016-03-07 ENCOUNTER — Telehealth: Payer: Self-pay | Admitting: Internal Medicine

## 2016-03-07 NOTE — Telephone Encounter (Signed)
PA request started for the Lunesta 2mg . PA was submitted & is pending. [id number]

## 2016-03-09 MED ORDER — ESZOPICLONE 2 MG PO TABS: 2.0000 mg | ORAL_TABLET | Freq: Every evening | ORAL | Status: DC | PRN

## 2016-03-09 NOTE — Telephone Encounter (Signed)
Okay 

## 2016-03-09 NOTE — Telephone Encounter (Signed)
Rx called in 

## 2016-03-09 NOTE — Telephone Encounter (Signed)
PA approved through 08/26/2016. Pharmacy aware. The Rx is expired. Okay to send a new one in?

## 2016-03-15 DIAGNOSIS — L820 Inflamed seborrheic keratosis: Secondary | ICD-10-CM | POA: Diagnosis not present

## 2016-03-15 DIAGNOSIS — L821 Other seborrheic keratosis: Secondary | ICD-10-CM | POA: Diagnosis not present

## 2016-03-15 DIAGNOSIS — D485 Neoplasm of uncertain behavior of skin: Secondary | ICD-10-CM | POA: Diagnosis not present

## 2016-03-15 DIAGNOSIS — L570 Actinic keratosis: Secondary | ICD-10-CM | POA: Diagnosis not present

## 2016-05-04 DIAGNOSIS — M25511 Pain in right shoulder: Secondary | ICD-10-CM | POA: Diagnosis not present

## 2016-05-18 DIAGNOSIS — M25551 Pain in right hip: Secondary | ICD-10-CM | POA: Diagnosis not present

## 2016-06-26 ENCOUNTER — Telehealth: Payer: Self-pay

## 2016-06-26 NOTE — Telephone Encounter (Signed)
Ask  patient to start taking omeprazole 20 mg daily.  Please schedule follow-up visit within the next 2-3 weeks

## 2016-06-26 NOTE — Telephone Encounter (Signed)
Pt called [facility] regarding reflux. They were unable to reach pt for return call.   Spoke with pt and he states that for several weeks when he is eating something "heavy" he often has issues with "the food going all the way down". Pt states that drinking water does help some but does not relieve the sensation of food being "stuck". Pt also reports mild chest discomfort during these episodes. Pt does not currently take any medications for GERD.   Dr. Raliegh Ip - Please advise. Thanks!

## 2016-06-26 NOTE — Telephone Encounter (Signed)
Spoke with pt and gave recommendations. Pt agrees to start medication. Pt scheduled to see Dr Raliegh Ip 07/17/16. Nothing further needed at this time.

## 2016-06-27 ENCOUNTER — Ambulatory Visit: Payer: PPO | Admitting: Adult Health

## 2016-07-17 ENCOUNTER — Encounter: Payer: Self-pay | Admitting: Internal Medicine

## 2016-07-17 ENCOUNTER — Ambulatory Visit (INDEPENDENT_AMBULATORY_CARE_PROVIDER_SITE_OTHER): Payer: PPO | Admitting: Internal Medicine

## 2016-07-17 VITALS — BP 110/80 | HR 81 | Temp 97.6°F | Ht 72.0 in | Wt 224.2 lb

## 2016-07-17 DIAGNOSIS — B353 Tinea pedis: Secondary | ICD-10-CM | POA: Diagnosis not present

## 2016-07-17 DIAGNOSIS — D1801 Hemangioma of skin and subcutaneous tissue: Secondary | ICD-10-CM | POA: Diagnosis not present

## 2016-07-17 DIAGNOSIS — E039 Hypothyroidism, unspecified: Secondary | ICD-10-CM

## 2016-07-17 DIAGNOSIS — Z23 Encounter for immunization: Secondary | ICD-10-CM | POA: Diagnosis not present

## 2016-07-17 DIAGNOSIS — K219 Gastro-esophageal reflux disease without esophagitis: Secondary | ICD-10-CM | POA: Diagnosis not present

## 2016-07-17 DIAGNOSIS — L570 Actinic keratosis: Secondary | ICD-10-CM | POA: Diagnosis not present

## 2016-07-17 DIAGNOSIS — L814 Other melanin hyperpigmentation: Secondary | ICD-10-CM | POA: Diagnosis not present

## 2016-07-17 DIAGNOSIS — D225 Melanocytic nevi of trunk: Secondary | ICD-10-CM | POA: Diagnosis not present

## 2016-07-17 DIAGNOSIS — L821 Other seborrheic keratosis: Secondary | ICD-10-CM | POA: Diagnosis not present

## 2016-07-17 DIAGNOSIS — Z85828 Personal history of other malignant neoplasm of skin: Secondary | ICD-10-CM | POA: Diagnosis not present

## 2016-07-17 MED ORDER — ESZOPICLONE 2 MG PO TABS: 2.0000 mg | ORAL_TABLET | Freq: Every evening | ORAL | 2 refills | Status: DC | PRN

## 2016-07-17 NOTE — Patient Instructions (Addendum)
Food Choices for Gastroesophageal Reflux Disease, Adult When you have gastroesophageal reflux disease (GERD), the foods you eat and your eating habits are very important. Choosing the right foods can help ease your discomfort. What guidelines do I need to follow?  Choose fruits, vegetables, whole grains, and low-fat dairy products.  Choose low-fat meat, fish, and poultry.  Limit fats such as oils, salad dressings, butter, nuts, and avocado.  Keep a food diary. This helps you identify foods that cause symptoms.  Avoid foods that cause symptoms. These may be different for everyone.  Eat small meals often instead of 3 large meals a day.  Eat your meals slowly, in a place where you are relaxed.  Limit fried foods.  Cook foods using methods other than frying.  Avoid drinking alcohol.  Avoid drinking large amounts of liquids with your meals.  Avoid bending over or lying down until 2-3 hours after eating. What foods are not recommended? These are some foods and drinks that may make your symptoms worse: Vegetables  Tomatoes. Tomato juice. Tomato and spaghetti sauce. Chili peppers. Onion and garlic. Horseradish. Fruits  Oranges, grapefruit, and lemon (fruit and juice). Meats  High-fat meats, fish, and poultry. This includes hot dogs, ribs, ham, sausage, salami, and bacon. Dairy  Whole milk and chocolate milk. Sour cream. Cream. Butter. Ice cream. Cream cheese. Drinks  Coffee and tea. Bubbly (carbonated) drinks or energy drinks. Condiments  Hot sauce. Barbecue sauce. Sweets/Desserts  Chocolate and cocoa. Donuts. Peppermint and spearmint. Fats and Oils  High-fat foods. This includes [place] fries and potato chips. Other  Vinegar. Strong spices. This includes black pepper, white pepper, red pepper, cayenne, curry powder, cloves, ginger, and chili powder. The items listed above may not be a complete list of foods and drinks to avoid. Contact your dietitian for more information.     Avoids foods high in acid such as tomatoes citrus juices, and spicy foods.  Avoid eating within two hours of lying down or before exercising.  Do not overheat.  Try smaller more frequent meals.  If symptoms persist, elevate the head of her bed, okay to resume Prevacid as needed

## 2016-07-17 NOTE — Progress Notes (Signed)
Subjective:    [name], male    DOB: 1929/01/27, 82 y.o.   MRN: [id number]  HPI  82 year old patient who has a history of gastroesophageal reflux disease.  He had a flare of typical symptoms about 1 month ago.  He made a office contacted and it was advised to take Prevacid daily for 2 weeks.  He quickly had resolution of his symptoms which have not reoccurred since discontinuation of this medication about 10 days ago.  Wt Readings from Last 3 Encounters:  07/17/16 224 lb 3.2 oz (101.7 kg)  02/29/16 217 lb (98.4 kg)  01/18/16 219 lb (99.3 kg)    Past Medical History:  Diagnosis Date  . Arthritis   . Cancer ([place])   . Chronic kidney disease    stones     Social History   Social History  . Marital status: Married    Spouse name: N/A  . Number of children: N/A  . Years of education: N/A   Occupational History  . Not on file.   Social History Main Topics  . Smoking status: Former Research scientist (life sciences)  . Smokeless tobacco: Never Used  . Alcohol use Yes  . Drug use: No  . Sexual activity: Not on file   Other Topics Concern  . Not on file   Social History Narrative  . No narrative on file    Past Surgical History:  Procedure Laterality Date  . CRANIECTOMY FOR EXCISION OF ACOUSTIC NEUROMA    . PROSTATE SURGERY      Family History  Problem Relation Age of Onset  . Arthritis Father     Allergies  Allergen Reactions  . Penicillins Itching    Current Outpatient Prescriptions on File Prior to Visit  Medication Sig Dispense Refill  . levothyroxine (SYNTHROID, LEVOTHROID) 50 MCG tablet Take 1 tablet (50 mcg total) by mouth daily. 90 tablet 4  . Multiple Vitamin (MULTIVITAMIN) tablet Take 1 tablet by mouth daily.      . zaleplon (SONATA) 5 MG capsule Take 1 capsule (5 mg total) by mouth at bedtime as needed for sleep. 30 capsule 5   No current facility-administered medications on file prior to visit.     BP 110/80 (BP Location: Left Arm, Patient Position:  Sitting, Cuff Size: Normal)   Pulse 81   Temp 97.6 F (36.4 C) (Oral)   Ht 6' (1.829 m)   Wt 224 lb 3.2 oz (101.7 kg)   SpO2 92%   BMI 30.41 kg/m     Review of Systems  Constitutional: Negative for appetite change, chills, fatigue and fever.  HENT: Negative for congestion, dental problem, ear pain, hearing loss, sore throat, tinnitus, trouble swallowing and voice change.   Eyes: Negative for pain, discharge and visual disturbance.  Respiratory: Negative for cough, chest tightness, wheezing and stridor.   Cardiovascular: Negative for chest pain, palpitations and leg swelling.  Gastrointestinal: Negative for abdominal distention, abdominal pain, blood in stool, constipation, diarrhea, nausea and vomiting.       Reflux symptoms have resolved  Genitourinary: Negative for difficulty urinating, discharge, flank pain, genital sores, hematuria and urgency.  Musculoskeletal: Negative for arthralgias, back pain, gait problem, joint swelling, myalgias and neck stiffness.  Skin: Negative for rash.  Neurological: Negative for dizziness, syncope, speech difficulty, weakness, numbness and headaches.  Hematological: Negative for adenopathy. Does not bruise/bleed easily.  Psychiatric/Behavioral: Negative for behavioral problems and dysphoric mood. The patient is not nervous/anxious.        Objective:  Physical Exam  Constitutional: He is oriented to person, place, and time. He appears well-developed.  HENT:  Head: Normocephalic.  Right Ear: External ear normal.  Left Ear: External ear normal.  Eyes: Conjunctivae and EOM are normal.  Neck: Normal range of motion.  Cardiovascular: Normal rate and normal heart sounds.   Pulmonary/Chest: Breath sounds normal.  Abdominal: Soft. Bowel sounds are normal. He exhibits no distension. There is no tenderness. There is no rebound and no guarding.  Musculoskeletal: Normal range of motion. He exhibits no edema or tenderness.  Neurological: He is alert and  oriented to person, place, and time.  Psychiatric: He has a normal mood and affect. His behavior is normal.          Assessment & Plan:   Flare of gastroesophageal reflux disease.  Antireflux diet.  Discussed and information dispensed.  Will hold off on further PPI therapy at this time unless symptoms recur  Annual exam 6 months as scheduled  Nyoka Cowden

## 2016-10-23 DIAGNOSIS — L820 Inflamed seborrheic keratosis: Secondary | ICD-10-CM | POA: Diagnosis not present

## 2016-10-23 DIAGNOSIS — L821 Other seborrheic keratosis: Secondary | ICD-10-CM | POA: Diagnosis not present

## 2016-10-23 DIAGNOSIS — D225 Melanocytic nevi of trunk: Secondary | ICD-10-CM | POA: Diagnosis not present

## 2016-10-23 DIAGNOSIS — D1800 Hemangioma unspecified site: Secondary | ICD-10-CM | POA: Diagnosis not present

## 2016-10-23 DIAGNOSIS — L570 Actinic keratosis: Secondary | ICD-10-CM | POA: Diagnosis not present

## 2016-10-23 DIAGNOSIS — L723 Sebaceous cyst: Secondary | ICD-10-CM | POA: Diagnosis not present

## 2016-11-15 DIAGNOSIS — H5203 Hypermetropia, bilateral: Secondary | ICD-10-CM | POA: Diagnosis not present

## 2016-11-15 DIAGNOSIS — H25013 Cortical age-related cataract, bilateral: Secondary | ICD-10-CM | POA: Diagnosis not present

## 2016-11-15 DIAGNOSIS — H2513 Age-related nuclear cataract, bilateral: Secondary | ICD-10-CM | POA: Diagnosis not present

## 2016-12-12 DIAGNOSIS — L720 Epidermal cyst: Secondary | ICD-10-CM | POA: Diagnosis not present

## 2016-12-12 DIAGNOSIS — L820 Inflamed seborrheic keratosis: Secondary | ICD-10-CM | POA: Diagnosis not present

## 2016-12-12 DIAGNOSIS — L723 Sebaceous cyst: Secondary | ICD-10-CM | POA: Diagnosis not present

## 2016-12-24 ENCOUNTER — Ambulatory Visit (INDEPENDENT_AMBULATORY_CARE_PROVIDER_SITE_OTHER): Payer: PPO | Admitting: Physician Assistant

## 2016-12-24 ENCOUNTER — Encounter (INDEPENDENT_AMBULATORY_CARE_PROVIDER_SITE_OTHER): Payer: Self-pay | Admitting: Physician Assistant

## 2016-12-24 ENCOUNTER — Ambulatory Visit (INDEPENDENT_AMBULATORY_CARE_PROVIDER_SITE_OTHER): Payer: PPO

## 2016-12-24 ENCOUNTER — Telehealth (INDEPENDENT_AMBULATORY_CARE_PROVIDER_SITE_OTHER): Payer: Self-pay | Admitting: Orthopaedic Surgery

## 2016-12-24 DIAGNOSIS — M25552 Pain in left hip: Secondary | ICD-10-CM | POA: Diagnosis not present

## 2016-12-24 DIAGNOSIS — M7062 Trochanteric bursitis, left hip: Secondary | ICD-10-CM | POA: Diagnosis not present

## 2016-12-24 MED ORDER — LIDOCAINE HCL 1 % IJ SOLN
3.0000 mL | INTRAMUSCULAR | Status: AC | PRN
  Administered 2016-12-24: 3 mL

## 2016-12-24 MED ORDER — METHYLPREDNISOLONE ACETATE 40 MG/ML IJ SUSP
40.0000 mg | INTRAMUSCULAR | Status: AC | PRN
  Administered 2016-12-24: 40 mg via INTRA_ARTICULAR

## 2016-12-24 NOTE — Telephone Encounter (Signed)
Returned call to patient left message to call back to schedule  Appointment.

## 2016-12-24 NOTE — Progress Notes (Signed)
Office Visit Note   [name]           Date of Birth: 09/15/1928           MRN: [id number] Visit Date: 12/24/2016              Requested by: Marletta Lor, MD [place], [place] [address] PCP: Nyoka Cowden, MD   Assessment & Plan: Visit Diagnoses:  1. Pain of left hip joint   2. Trochanteric bursitis, left hip     Plan: Iliotibial band stretching exercises are shown and had patient demonstrated these. Follow up in 2 weeks check his progress or lack of.  Follow-Up Instructions: Return in about 2 weeks (around 01/07/2017).   Orders:  Orders Placed This Encounter  Procedures  . XR HIP UNILAT W OR W/O PELVIS 2-3 VIEWS LEFT   No orders of the defined types were placed in this encounter.     Procedures: Large Joint Inj Date/Time: 12/24/2016 11:25 AM Performed by: Pete Pelt Authorized by: Pete Pelt   Consent Given by:  Patient Indications:  Pain Location:  Hip Site:  L greater trochanter Needle Size:  22 G Needle Length:  1.5 inches and 3.5 inches Approach:  Lateral Ultrasound Guidance: No   Fluoroscopic Guidance: No   Arthrogram: No   Medications:  40 mg methylPREDNISolone acetate 40 MG/ML; 3 mL lidocaine 1 % Aspiration Attempted: No   Patient tolerance:  Patient tolerated the procedure well with no immediate complications     Clinical Data: No additional findings.   Subjective: Chief Complaint  Patient presents with  . Left Hip - Pain    HPI Fred Stanley comes in today with left hip pain. He has seen in the past by Dr. Erlinda Hong before for  right trochanteric bursitis and has done well with cortisone injections in the past. This past Friday night he went to bed spine Saturday morning woke up and couldn't really walk due to the pain in left hip. He is not having any radicular symptoms down the leg. Pain is mostly lateral aspect left hip. No groin pain. No acute injury. He is taking Aleve some Celebrex and  Tylenol without much relief. He denies any back pain Review of Systems Denies left hip groin pain, numbness/ tingling down either leg, back pain.   Objective: Vital Signs: There were no vitals taken for this visit.  Physical Exam Well-developed well-nourished male in no acute distress. Mood and affect appropriate. He does ambulate with a slight antalgic gait and the use of a cane in his right hand. Ortho Exam Negative straight leg raise bilaterally. Tight hamstrings bilaterally. Tenderness over the left trochanteric region. Great range of motion of both hips without pain. Specialty Comments:  No specialty comments available.  Imaging: Xr Hip Unilat W Or W/o Pelvis 2-3 Views Left  Result Date: 12/24/2016 AP pelvis and lateral view of left hip: No acute fractures. No bony abnormalities. Hip joints are well preserved bilaterally    PMFS History: Patient Active Problem List   Diagnosis Date Noted  . Hypothyroidism 02/29/2016  . History of colonic polyps 06/07/2011  . GERD (gastroesophageal reflux disease) 06/07/2011  . Prostate cancer ([place]) 06/07/2011   Past Medical History:  Diagnosis Date  . Arthritis   . Cancer ([place])   . Chronic kidney disease    stones    Family History  Problem Relation Age of Onset  . Arthritis Father     Past Surgical  History:  Procedure Laterality Date  . CRANIECTOMY FOR EXCISION OF ACOUSTIC NEUROMA    . PROSTATE SURGERY     Social History   Occupational History  . Not on file.   Social History Main Topics  . Smoking status: Former Research scientist (life sciences)  . Smokeless tobacco: Never Used  . Alcohol use Yes  . Drug use: No  . Sexual activity: Not on file

## 2016-12-27 ENCOUNTER — Telehealth (INDEPENDENT_AMBULATORY_CARE_PROVIDER_SITE_OTHER): Payer: Self-pay

## 2016-12-27 ENCOUNTER — Other Ambulatory Visit (INDEPENDENT_AMBULATORY_CARE_PROVIDER_SITE_OTHER): Payer: Self-pay

## 2016-12-27 MED ORDER — TRAMADOL HCL 50 MG PO TABS: 50.0000 mg | ORAL_TABLET | Freq: Three times a day (TID) | ORAL | 0 refills | Status: DC | PRN

## 2016-12-27 NOTE — Telephone Encounter (Signed)
Patient states his pain is much worse. He wants another injection, but I told him we couldn't do that so close together. He wants to could it be something other than just bursitis? Could he get something for pain?

## 2016-12-27 NOTE — Telephone Encounter (Signed)
Can take time to resolve could be coming from his back . Send to PT if he wants for IT band stretching

## 2016-12-27 NOTE — Telephone Encounter (Signed)
Patient aware of the below message. States he can't do PT right now due to his pain he will call back

## 2017-01-01 ENCOUNTER — Ambulatory Visit (INDEPENDENT_AMBULATORY_CARE_PROVIDER_SITE_OTHER): Payer: PPO | Admitting: Orthopaedic Surgery

## 2017-01-01 DIAGNOSIS — M25552 Pain in left hip: Secondary | ICD-10-CM | POA: Diagnosis not present

## 2017-01-01 MED ORDER — HYDROCODONE-ACETAMINOPHEN 5-325 MG PO TABS: 1.0000 | ORAL_TABLET | Freq: Every evening | ORAL | 0 refills | Status: DC | PRN

## 2017-01-01 NOTE — Addendum Note (Signed)
Addended by: Precious Bard on: 01/01/2017 04:13 PM   Modules accepted: Orders

## 2017-01-01 NOTE — Progress Notes (Signed)
   Office Visit Note   [name]           Date of Birth: 02/12/1929           MRN: [id number] Visit Date: 01/01/2017              Requested by: Marletta Lor, MD [place],  [id number] PCP: Marletta Lor, MD   Assessment & Plan: Visit Diagnoses: No diagnosis found.  Plan: Recommend MRI of the lumbar spine to the rule out structural abnormalities. He had no relief from the cortisone injection.  Follow-Up Instructions: Return in about 2 weeks (around 01/15/2017).   Orders:  No orders of the defined types were placed in this encounter.  Meds ordered this encounter  Medications  . HYDROcodone-acetaminophen (NORCO) 5-325 MG tablet    Sig: Take 1 tablet by mouth at bedtime as needed.    Dispense:  10 tablet    Refill:  0      Procedures: No procedures performed   Clinical Data: No additional findings.   Subjective: Chief Complaint  Patient presents with  . Left Hip - Pain    Patient returns today for continued left lateral hip pain. He endorses radiation of pain down the front of his thigh.    Review of Systems  Constitutional: Negative.   All other systems reviewed and are negative.    Objective: Vital Signs: There were no vitals taken for this visit.  Physical Exam  Constitutional: He is oriented to person, place, and time. He appears well-developed and well-nourished.  Pulmonary/Chest: Effort normal.  Abdominal: Soft.  Neurological: He is alert and oriented to person, place, and time.  Skin: Skin is warm.  Psychiatric: He has a normal mood and affect. His behavior is normal. Judgment and thought content normal.  Nursing note and vitals reviewed.   Ortho Exam Left hip exam shows no tenderness palpation of the lateral hip. Specialty Comments:  No specialty comments available.  Imaging: No results found.   PMFS History: Patient Active Problem List   Diagnosis Date Noted  . Hypothyroidism  02/29/2016  . History of colonic polyps 06/07/2011  . GERD (gastroesophageal reflux disease) 06/07/2011  . Prostate cancer ([place]) 06/07/2011   Past Medical History:  Diagnosis Date  . Arthritis   . Cancer ([place])   . Chronic kidney disease    stones    Family History  Problem Relation Age of Onset  . Arthritis Father     Past Surgical History:  Procedure Laterality Date  . CRANIECTOMY FOR EXCISION OF ACOUSTIC NEUROMA    . PROSTATE SURGERY     Social History   Occupational History  . Not on file.   Social History Main Topics  . Smoking status: Former Research scientist (life sciences)  . Smokeless tobacco: Never Used  . Alcohol use Yes  . Drug use: No  . Sexual activity: Not on file

## 2017-01-04 ENCOUNTER — Telehealth (INDEPENDENT_AMBULATORY_CARE_PROVIDER_SITE_OTHER): Payer: Self-pay | Admitting: *Deleted

## 2017-01-04 NOTE — Telephone Encounter (Signed)
Pt called wanting to know if he can get something for pain during the day until his MRI appt on May 21st, pt is taking Hydrocodone at hs to help him sleep, and taking OTC meds Aleve, Tylenol during the day. Also, states that he was prescribed Tramadol and it is not working and wants something stronger for pain.  C/B# [place]

## 2017-01-04 NOTE — Telephone Encounter (Signed)
He can take hydrocodone during the day also.

## 2017-01-04 NOTE — Telephone Encounter (Signed)
Please advise 

## 2017-01-07 ENCOUNTER — Ambulatory Visit (INDEPENDENT_AMBULATORY_CARE_PROVIDER_SITE_OTHER): Payer: PPO | Admitting: Physician Assistant

## 2017-01-07 NOTE — Telephone Encounter (Signed)
Called pt to advise on message below, no answer. LMOM

## 2017-01-11 ENCOUNTER — Other Ambulatory Visit (INDEPENDENT_AMBULATORY_CARE_PROVIDER_SITE_OTHER): Payer: Self-pay | Admitting: Orthopaedic Surgery

## 2017-01-11 DIAGNOSIS — Z77018 Contact with and (suspected) exposure to other hazardous metals: Secondary | ICD-10-CM

## 2017-01-14 ENCOUNTER — Ambulatory Visit
Admission: RE | Admit: 2017-01-14 | Discharge: 2017-01-14 | Disposition: A | Discharge status: Home / Self Care | Payer: PPO | Source: Ambulatory Visit | Attending: Orthopaedic Surgery | Admitting: Orthopaedic Surgery

## 2017-01-14 DIAGNOSIS — Z77018 Contact with and (suspected) exposure to other hazardous metals: Secondary | ICD-10-CM

## 2017-01-14 DIAGNOSIS — M5126 Other intervertebral disc displacement, lumbar region: Secondary | ICD-10-CM | POA: Diagnosis not present

## 2017-01-14 DIAGNOSIS — M25552 Pain in left hip: Secondary | ICD-10-CM

## 2017-01-14 DIAGNOSIS — Z01818 Encounter for other preprocedural examination: Secondary | ICD-10-CM | POA: Diagnosis not present

## 2017-01-17 ENCOUNTER — Encounter (INDEPENDENT_AMBULATORY_CARE_PROVIDER_SITE_OTHER): Payer: Self-pay | Admitting: Orthopaedic Surgery

## 2017-01-17 ENCOUNTER — Ambulatory Visit (INDEPENDENT_AMBULATORY_CARE_PROVIDER_SITE_OTHER): Payer: PPO | Admitting: Orthopaedic Surgery

## 2017-01-17 DIAGNOSIS — M545 Low back pain: Secondary | ICD-10-CM

## 2017-01-17 DIAGNOSIS — G8929 Other chronic pain: Secondary | ICD-10-CM | POA: Diagnosis not present

## 2017-01-17 NOTE — Progress Notes (Signed)
   Office Visit Note   [name]           Date of Birth: 1929/05/04           MRN: [id number] Visit Date: 01/17/2017              Requested by: Marletta Lor, MD [place], [place] [id number] PCP: Marletta Lor, MD   Assessment & Plan: Visit Diagnoses:  1. Chronic bilateral low back pain, with sciatica presence unspecified     Plan: MRI of his lumbar spine shows multilevel degenerative disc disease with spondylolisthesis at L5-S1 with stenosis. We discussed treating this conservatively for as long as we can before consideration of surgery. Referral to Dr. Ernestina Patches ASAP for consideration of epidural steroid injection. Total face to face encounter time was greater than 25 minutes and over half of this time was spent in counseling and/or coordination of care.  Follow-Up Instructions: Return if symptoms worsen or fail to improve.   Orders:  Orders Placed This Encounter  Procedures  . Ambulatory referral to Physical Medicine Rehab   No orders of the defined types were placed in this encounter.     Procedures: No procedures performed   Clinical Data: No additional findings.   Subjective: Chief Complaint  Patient presents with  . Lower Back - Pain    Patient follows up today to review his MRI. He's been in severe pain. He denies any new focal numbness or deficits.    Review of Systems   Objective: Vital Signs: There were no vitals taken for this visit.  Physical Exam  Ortho Exam Exam is stable. Specialty Comments:  No specialty comments available.  Imaging: No results found.   PMFS History: Patient Active Problem List   Diagnosis Date Noted  . Pain of left hip joint 01/01/2017  . Hypothyroidism 02/29/2016  . History of colonic polyps 06/07/2011  . GERD (gastroesophageal reflux disease) 06/07/2011  . Prostate cancer ([place]) 06/07/2011   Past Medical History:  Diagnosis Date  . Arthritis   . Cancer ([place])   .  Chronic kidney disease    stones    Family History  Problem Relation Age of Onset  . Arthritis Father     Past Surgical History:  Procedure Laterality Date  . CRANIECTOMY FOR EXCISION OF ACOUSTIC NEUROMA    . PROSTATE SURGERY     Social History   Occupational History  . Not on file.   Social History Main Topics  . Smoking status: Former Research scientist (life sciences)  . Smokeless tobacco: Never Used  . Alcohol use Yes  . Drug use: No  . Sexual activity: Not on file

## 2017-01-22 ENCOUNTER — Ambulatory Visit (INDEPENDENT_AMBULATORY_CARE_PROVIDER_SITE_OTHER): Payer: PPO

## 2017-01-22 ENCOUNTER — Ambulatory Visit (INDEPENDENT_AMBULATORY_CARE_PROVIDER_SITE_OTHER): Payer: PPO | Admitting: Physical Medicine and Rehabilitation

## 2017-01-22 ENCOUNTER — Encounter (INDEPENDENT_AMBULATORY_CARE_PROVIDER_SITE_OTHER): Payer: Self-pay | Admitting: Physical Medicine and Rehabilitation

## 2017-01-22 VITALS — BP 129/78 | HR 70

## 2017-01-22 DIAGNOSIS — M5416 Radiculopathy, lumbar region: Secondary | ICD-10-CM

## 2017-01-22 DIAGNOSIS — M25552 Pain in left hip: Secondary | ICD-10-CM | POA: Diagnosis not present

## 2017-01-22 DIAGNOSIS — M5116 Intervertebral disc disorders with radiculopathy, lumbar region: Secondary | ICD-10-CM

## 2017-01-22 MED ORDER — LIDOCAINE HCL (PF) 1 % IJ SOLN
2.0000 mL | Freq: Once | INTRAMUSCULAR | Status: AC
  Administered 2017-01-22: 2 mL

## 2017-01-22 MED ORDER — METHYLPREDNISOLONE ACETATE 80 MG/ML IJ SUSP
80.0000 mg | Freq: Once | INTRAMUSCULAR | Status: AC
  Administered 2017-01-22: 80 mg

## 2017-01-22 NOTE — Patient Instructions (Signed)

## 2017-01-22 NOTE — Progress Notes (Deleted)
Left side low back pain for around 1 month. Radiates down front of leg to knee.  Constant pain with movement. Relief with sitting reclined. Denies numbness or tingling.

## 2017-01-24 ENCOUNTER — Encounter (INDEPENDENT_AMBULATORY_CARE_PROVIDER_SITE_OTHER): Payer: Self-pay | Admitting: Physical Medicine and Rehabilitation

## 2017-01-24 NOTE — Procedures (Signed)
Lumbosacral Transforaminal Epidural Steroid Injection - Infraneural Approach with Fluoroscopic Guidance  [name]      Date of Birth: April 21, 1929 MRN: [id number] PCP: Marletta Lor, MD      Visit Date: 01/22/2017   Universal Protocol:     Consent Given By: the patient  Position: PRONE   Additional Comments: Vital signs were monitored before and after the procedure. Patient was prepped and draped in the usual sterile fashion. The correct patient, procedure, and site was verified.   Injection Procedure Details:  Procedure Site One Meds Administered:  Meds ordered this encounter  Medications  . lidocaine (PF) (XYLOCAINE) 1 % injection 2 mL  . methylPREDNISolone acetate (DEPO-MEDROL) injection 80 mg      Laterality: Left  Location/Site:  L5-S1  Needle size: 22 G  Needle type: Spinal  Needle Placement: Transforaminal  Findings:  -Contrast Used: 1 mL iohexol 180 mg iodine/mL   -Comments: Excellent flow of contrast along the nerve and into the epidural space. Patient had extreme difficulty lying on stomach. We did roll him into an oblique position laying on the table and we were able to complete the injection without difficulty at that point.  Procedure Details: After squaring off the end-plates of the desired vertebral level to get a true AP view, the C-arm was obliqued to the painful side so that the superior articulating process is positioned about 1/3 the length of the inferior endplate.  The needle was aimed toward the junction of the superior articular process and the transverse process of the inferior vertebrae. The needle's initial entry is in the lower third of the foramen through Kambin's triangle. The soft tissues overlying this target were infiltrated with 2-3 ml. of 1% Lidocaine without Epinephrine.  The spinal needle was then inserted and advanced toward the target using a "trajectory" view along the fluoroscope beam.  Under AP and lateral  visualization, the needle was advanced so it did not puncture dura and did not traverse medially beyond the 6 o'clock position of the pedicle. Bi-planar projections were used to confirm position. Aspiration was confirmed to be negative for CSF and/or blood. A 1-2 ml. volume of Isovue-250 was injected and flow of contrast was noted at each level. Radiographs were obtained for documentation purposes.   After attaining the desired flow of contrast documented above, a 0.5 to 1.0 ml test dose of 0.25% Marcaine was injected into each respective transforaminal space.  The patient was observed for 90 seconds post injection.  After no sensory deficits were reported, and normal lower extremity motor function was noted,   the above injectate was administered so that equal amounts of the injectate were placed at each foramen (level) into the transforaminal epidural space.   Additional Comments:  The patient tolerated the procedure well Dressing: Band-Aid    Post-procedure details: Patient was observed during the procedure. Post-procedure instructions were reviewed.  Patient left the clinic in stable condition.

## 2017-01-24 NOTE — Progress Notes (Signed)
Fred Stanley - 83 y.o. male MRN [id number]  Date of birth: 10-Nov-1928  Office Visit Note: Visit Date: 01/22/2017 PCP: Fred Lor, MD Referred by: Fred Lor, MD  Subjective: Chief Complaint  Patient presents with  . Lower Back - Pain   HPI: Fred Stanley is a very pleasant 83 year old gentleman who comes in today in severe excruciating radicular left hip and leg pain predominantly in L5 distribution to the foot. He really has difficulty walking. He is walking with a cane. He says he has incredible pain with any weightbearing. He can get some relief with sitting and finally a good position. He reports the best position is reclining and laying flat be problematic. He denies any real paresthesias may get sharp shooting pain down the leg to the foot. He reports that this is been ongoing for over a month. I her chart shows that he sawGil Stanley, P.A.-C in the office for left hip pain. It also shows that intermittently over the last year or 2 years had what is felt to have been greater trochanteric bursitis which has responded at times to injection. He is seen Dr. Erlinda Stanley is well for bursitis and his primary care physician. However more recently when he saw Dr. Erlinda Stanley the pain declared itself and was radiating down the leg. He reports not being able to bear weight very much at all. He has taken tramadol hydrocodone with no relief. He has not noted bowel or bladder changes or specific night pain although he cannot rest at night because he can't get comfortable. Again he rates his pain as essentially a 10 out of 10 pain at this point. He reportedly onset of pain and did not coincide with any specific trauma or falls. MRI of the lumbar spine was obtained and this is reviewed below. The patient has a grade 2 listhesis due to bilateral pars defects at L5-S1 with likely encroachment of the L5 nerve roots bilaterally. He has no other central stenosis.    Review of Systems  Constitutional: Negative for  chills, fever, malaise/fatigue and weight loss.  HENT: Negative for hearing loss and sinus pain.   Eyes: Negative for blurred vision, double vision and photophobia.  Respiratory: Negative for cough and shortness of breath.   Cardiovascular: Negative for chest pain, palpitations and leg swelling.  Gastrointestinal: Negative for abdominal pain, nausea and vomiting.  Genitourinary: Negative for flank pain.  Musculoskeletal: Positive for back pain. Negative for myalgias.       Left leg pain  Skin: Negative for itching and rash.  Neurological: Negative for tremors, focal weakness and weakness.  Endo/Heme/Allergies: Negative.   Psychiatric/Behavioral: Negative for depression.  All other systems reviewed and are negative.  Otherwise per HPI.  Assessment & Plan: Visit Diagnoses:  1. Lumbar radiculopathy   2. Radiculopathy due to lumbar intervertebral disc disorder   3. Pain in left hip     Plan: Findings:  Chronic history of multiple bouts of greater trochanteric bursitis which may have in fact been bilateral L5 radicular pain throughout but just did not go past the hip area. He has bilateral pars defects which I tried to explain to him more conditions at probably were there since he was younger he just did not know is been no other imaging release to his knowledge. He does have a grade 2 listhesis and likely foraminal narrowing and nerve irritation. His pain is quite severe we are going to complete today and try to get authorization through health team and  manage for a left L5 transforaminal epidural steroid injection. As noted in the report she did have difficulty laying on his stomach and we did have to roll him up into an oblique position on the table in order to perform the injection but he didn't tolerate that fairly well. He seemed to be more comfortable during the anesthetic phase of the injection. Risks see how he does he can follow up by calling us to see how he does in the next week or 2  for follow up with Dr. Erlinda Stanley. He is probably going to benefit from skilled physical therapy at least to learn something she can do for stretching hamstrings and becoming more mobile. This is probably going to be a situation that flares up from time to time. He may benefit from surgical referral just from information standpoint. There is no focal weakness on exam. I spent more than 25 minutes speaking face-to-face with the patient with 50% of the time in counseling.    Meds & Orders:  Meds ordered this encounter  Medications  . lidocaine (PF) (XYLOCAINE) 1 % injection 2 mL  . methylPREDNISolone acetate (DEPO-MEDROL) injection 80 mg    Orders Placed This Encounter  Procedures  . XR C-ARM NO REPORT  . Epidural Steroid injection    Follow-up: Return if symptoms worsen or fail to improve.   Procedures: No procedures performed  Lumbosacral Transforaminal Epidural Steroid Injection - Infraneural Approach with Fluoroscopic Guidance  [name]      Date of Birth: 04/07/1929 MRN: [id number] PCP: Fred Lor, MD      Visit Date: 01/22/2017   Universal Protocol:     Consent Given By: the patient  Position: PRONE   Additional Comments: Vital signs were monitored before and after the procedure. Patient was prepped and draped in the usual sterile fashion. The correct patient, procedure, and site was verified.   Injection Procedure Details:  Procedure Site One Meds Administered:  Meds ordered this encounter  Medications  . lidocaine (PF) (XYLOCAINE) 1 % injection 2 mL  . methylPREDNISolone acetate (DEPO-MEDROL) injection 80 mg      Laterality: Left  Location/Site:  L5-S1  Needle size: 22 G  Needle type: Spinal  Needle Placement: Transforaminal  Findings:  -Contrast Used: 1 mL iohexol 180 mg iodine/mL   -Comments: Excellent flow of contrast along the nerve and into the epidural space. Patient had extreme difficulty lying on stomach. We did roll him into an  oblique position laying on the table and we were able to complete the injection without difficulty at that point.  Procedure Details: After squaring off the end-plates of the desired vertebral level to get a true AP view, the C-arm was obliqued to the painful side so that the superior articulating process is positioned about 1/3 the length of the inferior endplate.  The needle was aimed toward the junction of the superior articular process and the transverse process of the inferior vertebrae. The needle's initial entry is in the lower third of the foramen through Kambin's triangle. The soft tissues overlying this target were infiltrated with 2-3 ml. of 1% Lidocaine without Epinephrine.  The spinal needle was then inserted and advanced toward the target using a "trajectory" view along the fluoroscope beam.  Under AP and lateral visualization, the needle was advanced so it did not puncture dura and did not traverse medially beyond the 6 o'clock position of the pedicle. Bi-planar projections were used to confirm position. Aspiration was confirmed to be negative  for CSF and/or blood. A 1-2 ml. volume of Isovue-250 was injected and flow of contrast was noted at each level. Radiographs were obtained for documentation purposes.   After attaining the desired flow of contrast documented above, a 0.5 to 1.0 ml test dose of 0.25% Marcaine was injected into each respective transforaminal space.  The patient was observed for 90 seconds post injection.  After no sensory deficits were reported, and normal lower extremity motor function was noted,   the above injectate was administered so that equal amounts of the injectate were placed at each foramen (level) into the transforaminal epidural space.   Additional Comments:  The patient tolerated the procedure well Dressing: Band-Aid    Post-procedure details: Patient was observed during the procedure. Post-procedure instructions were reviewed.  Patient left the  clinic in stable condition.   Clinical History: Lumbar MRI 01/14/2017  L3-4: Annular disc bulging is eccentric to the left. There is moderate facet and ligamentous hypertrophy. These factors contribute to mild spinal stenosis with mild narrowing of the lateral recesses, left greater than right. The foramina appear sufficiently patent.  L4-5: Loss of disc height with annular disc bulging and endplate osteophytes asymmetric to the right. Moderate facet and ligamentous hypertrophy. The central spinal canal and lateral recesses are adequately patent. There is mild foraminal narrowing on the right.  L5-S1: As above, there is a grade 2 anterolisthesis secondary to bilateral L5 pars defects. There is advanced loss of disc height with endplate osteophytes and moderate foraminal narrowing bilaterally. Bilateral L5 nerve root encroachment likely.  IMPRESSION: 1. Chronic bilateral L5 pars defects with grade 2 anterolisthesis and moderate foraminal narrowing bilaterally at L5-S1. Bilateral L5 nerve root encroachment likely. 2. Mild right foraminal narrowing at L4-5. 3. Mild multifactorial spinal stenosis at L3-4 with narrowing of the lateral recesses, left greater than right.  He reports that he has quit smoking. He has never used smokeless tobacco. No results for input(s): HGBA1C, LABURIC in the last 8760 hours.  Objective:  VS:  HT:    WT:   BMI:     BP:129/78  HR:70bpm  TEMP: ( )  RESP:99 % Physical Exam  Constitutional: He is oriented to person, place, and time. He appears well-developed and well-nourished. No distress.  HENT:  Head: Normocephalic and atraumatic.  Eyes: Conjunctivae are normal. Pupils are equal, round, and reactive to light.  Neck: Normal range of motion. Neck supple.  Cardiovascular: Regular rhythm and intact distal pulses.   Pulmonary/Chest: Effort normal. No respiratory distress.  Musculoskeletal:  Patient ambulates slowly and slow to rise from a seated  position. He does use a cane today. He has no pain with hip rotation. He is somewhat tender over the greater trochanter. He has good distal strength and no clonus bilaterally. He has pain with extension of the lumbar spine.  Neurological: He is alert and oriented to person, place, and time. He exhibits normal muscle tone. Coordination normal.  Skin: Skin is warm and dry. No rash noted. No erythema.  Psychiatric: He has a normal mood and affect.  Nursing note and vitals reviewed.   Ortho Exam Imaging: No results found.  Past Medical/Family/Surgical/Social History: Medications & Allergies reviewed per EMR Patient Active Problem List   Diagnosis Date Noted  . Pain of left hip joint 01/01/2017  . Hypothyroidism 02/29/2016  . History of colonic polyps 06/07/2011  . GERD (gastroesophageal reflux disease) 06/07/2011  . Prostate cancer ([place]) 06/07/2011   Past Medical History:  Diagnosis Date  . Arthritis   .  Cancer ([place])   . Chronic kidney disease    stones   Family History  Problem Relation Age of Onset  . Arthritis Father    Past Surgical History:  Procedure Laterality Date  . CRANIECTOMY FOR EXCISION OF ACOUSTIC NEUROMA    . PROSTATE SURGERY     Social History   Occupational History  . Not on file.   Social History Main Topics  . Smoking status: Former Research scientist (life sciences)  . Smokeless tobacco: Never Used  . Alcohol use Yes  . Drug use: No  . Sexual activity: Not on file

## 2017-01-31 ENCOUNTER — Telehealth (INDEPENDENT_AMBULATORY_CARE_PROVIDER_SITE_OTHER): Payer: Self-pay | Admitting: Physical Medicine and Rehabilitation

## 2017-01-31 NOTE — Telephone Encounter (Signed)
If it helped some and is lasting then two level trans and one level will be repeat level, might add S1 .  Also he was not able to lay on stomach very well.?

## 2017-01-31 NOTE — Telephone Encounter (Signed)
Needs auth for [id number] and [phone]. Scheduled for 02/18/17 with driver.

## 2017-02-04 NOTE — Telephone Encounter (Signed)
Submitted auth request on [employer] website 

## 2017-02-11 ENCOUNTER — Encounter: Payer: Self-pay | Admitting: Internal Medicine

## 2017-02-11 ENCOUNTER — Ambulatory Visit (INDEPENDENT_AMBULATORY_CARE_PROVIDER_SITE_OTHER): Payer: PPO | Admitting: Internal Medicine

## 2017-02-11 VITALS — BP 124/80 | HR 79 | Temp 98.4°F | Ht 72.0 in | Wt 195.0 lb

## 2017-02-11 DIAGNOSIS — M5440 Lumbago with sciatica, unspecified side: Secondary | ICD-10-CM | POA: Diagnosis not present

## 2017-02-11 MED ORDER — HYDROCODONE-ACETAMINOPHEN 5-325 MG PO TABS: 1.0000 | ORAL_TABLET | Freq: Every evening | ORAL | 0 refills | Status: AC | PRN

## 2017-02-11 NOTE — Progress Notes (Signed)
Subjective:    [name], male    DOB: 09-Dec-1928, 83 y.o.   MRN: [id number]  HPI  83 year old patient who presents today with a chief complaint of back pain.  This started almost 2 months ago and is associated with the left leg pain.  He has been followed closely by [facility] and did have a lumbar MRI performed on May 21.  He has been treated with the parenteral steroids as well as the epidural injections.  He is scheduled for follow-up in 7 days. He remains quite uncomfortable.  Symptomatic treatment has included both tramadol and hydrocodone with little benefit.  Past Medical History:  Diagnosis Date  . Arthritis   . Cancer ([place])   . Chronic kidney disease    stones     Social History   Social History  . Marital status: Married    Spouse name: N/A  . Number of children: N/A  . Years of education: N/A   Occupational History  . Not on file.   Social History Main Topics  . Smoking status: Former Research scientist (life sciences)  . Smokeless tobacco: Never Used  . Alcohol use Yes  . Drug use: No  . Sexual activity: Not on file   Other Topics Concern  . Not on file   Social History Narrative  . No narrative on file    Past Surgical History:  Procedure Laterality Date  . CRANIECTOMY FOR EXCISION OF ACOUSTIC NEUROMA    . PROSTATE SURGERY      Family History  Problem Relation Age of Onset  . Arthritis Father     Allergies  Allergen Reactions  . Penicillins Itching    Current Outpatient Prescriptions on File Prior to Visit  Medication Sig Dispense Refill  . eszopiclone (LUNESTA) 2 MG TABS tablet Take 1 tablet (2 mg total) by mouth at bedtime as needed. for sleep 30 tablet 2  . levothyroxine (SYNTHROID, LEVOTHROID) 50 MCG tablet Take 1 tablet (50 mcg total) by mouth daily. 90 tablet 4  . Multiple Vitamin (MULTIVITAMIN) tablet Take 1 tablet by mouth daily.       No current facility-administered medications on file prior to visit.     BP 124/80 (BP Location:  Left Arm, Patient Position: Sitting, Cuff Size: Normal)   Pulse 79   Temp 98.4 F (36.9 C) (Oral)   Ht 6' (1.829 m)   Wt 195 lb (88.5 kg)   SpO2 97%   BMI 26.45 kg/m     Review of Systems  Constitutional: Negative for appetite change, chills, fatigue and fever.  HENT: Negative for congestion, dental problem, ear pain, hearing loss, sore throat, tinnitus, trouble swallowing and voice change.   Eyes: Negative for pain, discharge and visual disturbance.  Respiratory: Negative for cough, chest tightness, wheezing and stridor.   Cardiovascular: Negative for chest pain, palpitations and leg swelling.  Gastrointestinal: Negative for abdominal distention, abdominal pain, blood in stool, constipation, diarrhea, nausea and vomiting.  Genitourinary: Negative for difficulty urinating, discharge, flank pain, genital sores, hematuria and urgency.  Musculoskeletal: Positive for back pain. Negative for arthralgias, gait problem, joint swelling, myalgias and neck stiffness.  Skin: Negative for rash.  Neurological: Negative for dizziness, syncope, speech difficulty, weakness, numbness and headaches.  Hematological: Negative for adenopathy. Does not bruise/bleed easily.  Psychiatric/Behavioral: Negative for behavioral problems and dysphoric mood. The patient is not nervous/anxious.        Objective:   Physical Exam  Constitutional: He appears well-developed and well-nourished. He appears  distressed.  Musculoskeletal:  Positive positive straight leg test on the left Able to walk on toes and heels Reflexes intact            Assessment & Plan:   Low back pain with sciatica.  Results of the MRI.  Discussed with patient.  He is scheduled for orthopedic follow-up in 7 days We'll continue supportive and symptomatic treatment  Nyoka Cowden

## 2017-02-11 NOTE — Patient Instructions (Addendum)
WE NOW OFFER   [facility] FAST TRACK!!!  SAME DAY Appointments for ACUTE CARE  Such as: Sprains, Injuries, cuts, abrasions, rashes, muscle pain, joint pain, back pain Colds, flu, sore throats, headache, allergies, cough, fever  Ear pain, sinus and eye infections Abdominal pain, nausea, vomiting, diarrhea, upset stomach Animal/insect bites  3 Easy Ways to Schedule: Walk-In Scheduling Call in scheduling Mychart Sign-up: [email or web address]   Follow-up [facility] orthopedic next week as scheduled  Most patients with low back pain will improve with time over the next two to 6 weeks.  Keep active but avoid any activities that cause pain.  Apply moist heat to the low back area several times daily.

## 2017-02-13 NOTE — Telephone Encounter (Signed)
Still pending per website 

## 2017-02-18 ENCOUNTER — Ambulatory Visit (INDEPENDENT_AMBULATORY_CARE_PROVIDER_SITE_OTHER): Payer: PPO

## 2017-02-18 ENCOUNTER — Encounter (INDEPENDENT_AMBULATORY_CARE_PROVIDER_SITE_OTHER): Payer: Self-pay | Admitting: Physical Medicine and Rehabilitation

## 2017-02-18 ENCOUNTER — Ambulatory Visit (INDEPENDENT_AMBULATORY_CARE_PROVIDER_SITE_OTHER): Payer: PPO | Admitting: Physical Medicine and Rehabilitation

## 2017-02-18 VITALS — BP 134/85 | HR 60 | Temp 98.2°F

## 2017-02-18 DIAGNOSIS — M5416 Radiculopathy, lumbar region: Secondary | ICD-10-CM | POA: Diagnosis not present

## 2017-02-18 DIAGNOSIS — M4316 Spondylolisthesis, lumbar region: Secondary | ICD-10-CM

## 2017-02-18 DIAGNOSIS — Q762 Congenital spondylolisthesis: Secondary | ICD-10-CM | POA: Diagnosis not present

## 2017-02-18 MED ORDER — LIDOCAINE HCL (PF) 1 % IJ SOLN
2.0000 mL | Freq: Once | INTRAMUSCULAR | Status: AC
  Administered 2017-02-18: 2 mL

## 2017-02-18 MED ORDER — METHYLPREDNISOLONE ACETATE 80 MG/ML IJ SUSP
80.0000 mg | Freq: Once | INTRAMUSCULAR | Status: AC
  Administered 2017-02-18: 80 mg

## 2017-02-18 MED ORDER — GABAPENTIN 400 MG PO CAPS: 400.0000 mg | ORAL_CAPSULE | Freq: Every day | ORAL | 0 refills | Status: DC

## 2017-02-18 NOTE — Progress Notes (Deleted)
Patient states he had minimal to no relief with last injection. Pain is left sided and radiates down leg to knee.

## 2017-02-18 NOTE — Telephone Encounter (Signed)
Received auth for [phone] and [phone] 1 unit. eff 02/04/17-05/05/17. Pt scheduled for today.

## 2017-02-18 NOTE — Patient Instructions (Signed)

## 2017-02-18 NOTE — Procedures (Signed)
Lumbosacral Transforaminal Epidural Steroid Injection - Sub-Pedicular Approach with Fluoroscopic Guidance  [name]      Date of Birth: 12-17-1928 MRN: [id number] PCP: Marletta Lor, MD      Visit Date: 02/18/2017   Fred Stanley is a pleasant 83 year old gentleman with chronic worsening severe several month history of left radicular type pain from the buttock hip region laterally and anteriorly to the knee. His exam is not consistent with hip pathology does not have pain with hip rotation. He has an MRI showing grade 2 listhesis of L5 on S1 from bilateral pars defects. Prior injection at L5 offered no relief at all. His symptoms are more L4 dermatome if this truly is spine related. His MRI findings don't really show much in the way of a consistent problem for an L4 radicular pattern but he clearly has that clinically. There is small disc herniation broadly a little bit more left paracentral L3-4 could irritate the lateral recess affecting the L4 nerve root but is not focally compressing it. I do want try an L4 transforaminal injection diagnostically today. We'll start gabapentin at night to see if he gets some sleep and 83 that we'll start to help some of this radicular pain. He has been taking hydrocodone but his primary care physician without much relief at all. We'll have him call us in 10 days or so to see how he is doing. Exam does not show any focal weakness. He does ambulate with a cane.  Universal Protocol:    Date/Time: 02/18/1809:15 AM  Consent Given By: the patient  Position: prone  Additional Comments: Vital signs were monitored before and after the procedure. Patient was prepped and draped in the usual sterile fashion. The correct patient, procedure, and site was verified.   Injection Procedure Details:  Procedure Site One Meds Administered:  Meds ordered this encounter  Medications  . lidocaine (PF) (XYLOCAINE) 1 % injection 2 mL  . methylPREDNISolone acetate  (DEPO-MEDROL) injection 80 mg    Laterality: Left  Location/Site:  L4-L5  Needle size: 22 G  Needle type: Spinal  Needle Placement: Transforaminal  Findings:  -Contrast Used: 1 mL iohexol 180 mg iodine/mL   -Comments: Excellent flow of contrast along the nerve and into the epidural space.  Procedure Details: After squaring off the end-plates to get a true AP view, the C-arm was positioned so that an oblique view of the foramen as noted above was visualized. The target area is just inferior to the "nose of the scotty dog" or sub pedicular. The soft tissues overlying this structure were infiltrated with 2-3 ml. of 1% Lidocaine without Epinephrine.  The spinal needle was inserted toward the target using a "trajectory" view along the fluoroscope beam.  Under AP and lateral visualization, the needle was advanced so it did not puncture dura and was located close the 6 O'Clock position of the pedical in AP tracterory. Biplanar projections were used to confirm position. Aspiration was confirmed to be negative for CSF and/or blood. A 1-2 ml. volume of Isovue-250 was injected and flow of contrast was noted at each level. Radiographs were obtained for documentation purposes.   After attaining the desired flow of contrast documented above, a 0.5 to 1.0 ml test dose of 0.25% Marcaine was injected into each respective transforaminal space.  The patient was observed for 90 seconds post injection.  After no sensory deficits were reported, and normal lower extremity motor function was noted,   the above injectate was administered so that  equal amounts of the injectate were placed at each foramen (level) into the transforaminal epidural space.   Additional Comments:  The patient tolerated the procedure well Dressing: Band-Aid    Post-procedure details: Patient was observed during the procedure. Post-procedure instructions were reviewed.  Patient left the clinic in stable condition.

## 2017-03-05 ENCOUNTER — Telehealth (INDEPENDENT_AMBULATORY_CARE_PROVIDER_SITE_OTHER): Payer: Self-pay | Admitting: Physical Medicine and Rehabilitation

## 2017-03-05 NOTE — Telephone Encounter (Signed)
Needs HTA [place] for [phone].

## 2017-03-05 NOTE — Telephone Encounter (Signed)
Yes if that much relief would repeat x1

## 2017-03-07 NOTE — Telephone Encounter (Signed)
Submitted auth request on [employer] website 

## 2017-03-18 NOTE — Telephone Encounter (Signed)
[facility]#[id number] for [id number] eff 03/07/17-06/05/17. Pt scheduled for 04/01/17

## 2017-03-27 ENCOUNTER — Ambulatory Visit (INDEPENDENT_AMBULATORY_CARE_PROVIDER_SITE_OTHER): Payer: PPO | Admitting: Internal Medicine

## 2017-03-27 ENCOUNTER — Encounter: Payer: Self-pay | Admitting: Internal Medicine

## 2017-03-27 VITALS — BP 110/78 | HR 66 | Temp 97.7°F | Ht 72.0 in | Wt 197.0 lb

## 2017-03-27 DIAGNOSIS — E038 Other specified hypothyroidism: Secondary | ICD-10-CM

## 2017-03-27 DIAGNOSIS — C61 Malignant neoplasm of prostate: Secondary | ICD-10-CM | POA: Diagnosis not present

## 2017-03-27 DIAGNOSIS — Z86010 Personal history of colonic polyps: Secondary | ICD-10-CM

## 2017-03-27 DIAGNOSIS — Z0000 Encounter for general adult medical examination without abnormal findings: Secondary | ICD-10-CM | POA: Diagnosis not present

## 2017-03-27 LAB — CBC WITH DIFFERENTIAL/PLATELET
BASOS ABS: 0 10*3/uL (ref 0.0–0.1)
BASOS PCT: 0.6 % (ref 0.0–3.0)
Eosinophils Absolute: 0.3 10*3/uL (ref 0.0–0.7)
Eosinophils Relative: 4.2 % (ref 0.0–5.0)
HEMATOCRIT: 42.3 % (ref 39.0–52.0)
Hemoglobin: 14.1 g/dL (ref 13.0–17.0)
LYMPHS ABS: 1.7 10*3/uL (ref 0.7–4.0)
LYMPHS PCT: 26.4 % (ref 12.0–46.0)
MCHC: 33.4 g/dL (ref 30.0–36.0)
MCV: 93.9 fl (ref 78.0–100.0)
Monocytes Absolute: 0.5 10*3/uL (ref 0.1–1.0)
Monocytes Relative: 8.1 % (ref 3.0–12.0)
NEUTROS ABS: 3.9 10*3/uL (ref 1.4–7.7)
NEUTROS PCT: 60.7 % (ref 43.0–77.0)
PLATELETS: 318 10*3/uL (ref 150.0–400.0)
RBC: 4.5 Mil/uL (ref 4.22–5.81)
RDW: 14.3 % (ref 11.5–15.5)
WBC: 6.4 10*3/uL (ref 4.0–10.5)

## 2017-03-27 LAB — COMPREHENSIVE METABOLIC PANEL
ALT: 20 U/L (ref 0–53)
AST: 16 U/L (ref 0–37)
Albumin: 3.9 g/dL (ref 3.5–5.2)
Alkaline Phosphatase: 53 U/L (ref 39–117)
BILIRUBIN TOTAL: 0.8 mg/dL (ref 0.2–1.2)
BUN: 23 mg/dL (ref 6–23)
CALCIUM: 9.6 mg/dL (ref 8.4–10.5)
CHLORIDE: 106 meq/L (ref 96–112)
CO2: 31 meq/L (ref 19–32)
CREATININE: 1.24 mg/dL (ref 0.40–1.50)
GFR: 58.72 mL/min — ABNORMAL LOW (ref >60.00)
GLUCOSE: 97 mg/dL (ref 70–99)
Potassium: 4.9 mEq/L (ref 3.5–5.1)
SODIUM: 141 meq/L (ref 135–145)
Total Protein: 6.2 g/dL (ref 6.0–8.3)

## 2017-03-27 LAB — TSH: TSH: 2.16 u[IU]/mL (ref 0.35–4.50)

## 2017-03-27 LAB — PSA: PSA: 0.13 ng/mL (ref 0.10–4.00)

## 2017-03-27 NOTE — Progress Notes (Signed)
Subjective:    [name], male    DOB: 04/01/1929, 83 y.o.   MRN: [id number]  HPI  83 year old patient who is seen today for a preventive health examination and subsequent Medicare wellness visit He has been bothered by a lumbar radiculopathy since the end of April and has had at least 2 epidurals.  He has had some nice recent improvement.  He is scheduled for neurosurgical evaluation.  Next month He has remote history of prostate cancer, hypothyroidism, and gastroesophageal reflux disease. No new concerns or complaints  Past Medical History:  Diagnosis Date  . Arthritis   . Cancer ([place])   . Chronic kidney disease    stones     Social History   Social History  . Marital status: Married    Spouse name: N/A  . Number of children: N/A  . Years of education: N/A   Occupational History  . Not on file.   Social History Main Topics  . Smoking status: Former Research scientist (life sciences)  . Smokeless tobacco: Never Used  . Alcohol use Yes  . Drug use: No  . Sexual activity: Not on file   Other Topics Concern  . Not on file   Social History Narrative  . No narrative on file    Past Surgical History:  Procedure Laterality Date  . CRANIECTOMY FOR EXCISION OF ACOUSTIC NEUROMA    . PROSTATE SURGERY      Family History  Problem Relation Age of Onset  . Arthritis Father     Allergies  Allergen Reactions  . Penicillins Itching    Current Outpatient Prescriptions on File Prior to Visit  Medication Sig Dispense Refill  . eszopiclone (LUNESTA) 2 MG TABS tablet Take 1 tablet (2 mg total) by mouth at bedtime as needed. for sleep 30 tablet 2  . gabapentin (NEURONTIN) 400 MG capsule Take 1 capsule (400 mg total) by mouth at bedtime. 90 capsule 0  . HYDROcodone-acetaminophen (NORCO) 5-325 MG tablet Take 1 tablet by mouth at bedtime as needed. 60 tablet 0  . levothyroxine (SYNTHROID, LEVOTHROID) 50 MCG tablet Take 1 tablet (50 mcg total) by mouth daily. 90 tablet 4  . Multiple  Vitamin (MULTIVITAMIN) tablet Take 1 tablet by mouth daily.       No current facility-administered medications on file prior to visit.     BP 110/78 (BP Location: Left Arm, Patient Position: Sitting, Cuff Size: Normal)   Pulse 66   Temp 97.7 F (36.5 C) (Oral)   Ht 6' (1.829 m)   Wt 197 lb (89.4 kg)   SpO2 95%   BMI 26.72 kg/m   Subsequent Medicare wellness visit    1. Risk factors, based on past  M,S,F history. Cardiovascular risk factors include age and male sex  2.  Physical activities:remains quite active with walking and yard work, limited more recently due to lumbar radiculopathy  3.  Depression/mood:no history of major depression or mood disorder  4.  Hearing:use a hearing aid left ear  5.  ADL's:independent in all aspects of daily living  6.  Fall risk:moderate due to age  83.  Home safety:no problems identified  8.  Height weight, and visual acuity;height and weight stable no change in visual acuity is seen by ophthalmology annually  9.  Counseling:continue heart healthy diet and active lifestyle  10. Lab orders based on risk factors:laboratory update will be reviewed, including PSA due to prostate cancer as well as TSH  11. Referral :follow-up neurosurgery  12. Care  plan:continue efforts at aggressive risk factor modification  13. Cognitive assessment: alert and oriented with normal affect.  No cognitive dysfunction  14. Screening: Patient provided with a written and personalized 5-10 year screening schedule in the AVS.    15. Provider List Update: neurosurgery primary care orthopedics, ophthalmology as well as dermatology    Review of Systems  Constitutional: Negative for appetite change, chills, fatigue and fever.  HENT: Negative for congestion, dental problem, ear pain, hearing loss, sore throat, tinnitus, trouble swallowing and voice change.   Eyes: Negative for pain, discharge and visual disturbance.  Respiratory: Negative for cough, chest tightness,  wheezing and stridor.   Cardiovascular: Negative for chest pain, palpitations and leg swelling.  Gastrointestinal: Negative for abdominal distention, abdominal pain, blood in stool, constipation, diarrhea, nausea and vomiting.  Genitourinary: Negative for difficulty urinating, discharge, flank pain, genital sores, hematuria and urgency.  Musculoskeletal: Positive for back pain and gait problem. Negative for arthralgias, joint swelling, myalgias and neck stiffness.  Skin: Negative for rash.  Neurological: Negative for dizziness, syncope, speech difficulty, weakness, numbness and headaches.  Hematological: Negative for adenopathy. Does not bruise/bleed easily.  Psychiatric/Behavioral: Positive for sleep disturbance. Negative for behavioral problems and dysphoric mood. The patient is not nervous/anxious.        Objective:   Physical Exam  Constitutional: He appears well-developed and well-nourished.  Blood pressure low normal  HENT:  Head: Normocephalic and atraumatic.  Right Ear: External ear normal.  Left Ear: External ear normal.  Nose: Nose normal.  Mouth/Throat: Oropharynx is clear and moist.  Hearing aid left ear  Eyes: Pupils are equal, round, and reactive to light. Conjunctivae and EOM are normal. No scleral icterus.  Arcus senilis  Neck: Normal range of motion. Neck supple. No JVD present. No thyromegaly present.  Cardiovascular: Regular rhythm, normal heart sounds and intact distal pulses.  Exam reveals no gallop and no friction rub.   No murmur heard. Pedal pulses intact  Pulmonary/Chest: Effort normal and breath sounds normal. He exhibits no tenderness.  Abdominal: Soft. Bowel sounds are normal. He exhibits no distension and no mass. There is no tenderness.  Lower suprapubic scar  Genitourinary: Prostate normal and penis normal.  Musculoskeletal: Normal range of motion. He exhibits no edema or tenderness.  Lymphadenopathy:    He has no cervical adenopathy.  Neurological:  He is alert. He has normal reflexes. No cranial nerve deficit. Coordination normal.  Absent vibratory sensation lower extremities  Skin: Skin is warm and dry. No rash noted.  Scattered surgical scars due to resections of skin neoplasms  onychomycotic toenail changes  Psychiatric: He has a normal mood and affect. His behavior is normal.          Assessment & Plan:   Preventive health examination Subsequent Medicare wellness visit Hypothyroidism.  We'll check a TSH Lumbar radiculopathy, improving History colonic polyps.  Stool hematest negative.  Will review a CBC Gastroesophageal reflux disease  Review screening lab Follow-up one year or as needed  Nyoka Cowden

## 2017-03-27 NOTE — Patient Instructions (Signed)
Avoids foods high in acid such as tomatoes citrus juices, and spicy foods.  Avoid eating within two hours of lying down or before exercising.  Do not overheat.  Try smaller more frequent meals.     It is important that you exercise regularly, at least 20 minutes 3 to 4 times per week.  If you develop chest pain or shortness of breath seek  medical attention.  Obtain a flu vaccine in the fall  .

## 2017-04-01 ENCOUNTER — Ambulatory Visit (INDEPENDENT_AMBULATORY_CARE_PROVIDER_SITE_OTHER): Payer: PPO | Admitting: Physical Medicine and Rehabilitation

## 2017-04-01 ENCOUNTER — Encounter (INDEPENDENT_AMBULATORY_CARE_PROVIDER_SITE_OTHER): Payer: Self-pay | Admitting: Physical Medicine and Rehabilitation

## 2017-04-01 ENCOUNTER — Ambulatory Visit (INDEPENDENT_AMBULATORY_CARE_PROVIDER_SITE_OTHER): Payer: PPO

## 2017-04-01 ENCOUNTER — Telehealth (INDEPENDENT_AMBULATORY_CARE_PROVIDER_SITE_OTHER): Payer: Self-pay

## 2017-04-01 VITALS — BP 119/65 | HR 79

## 2017-04-01 DIAGNOSIS — M4316 Spondylolisthesis, lumbar region: Secondary | ICD-10-CM

## 2017-04-01 DIAGNOSIS — M5116 Intervertebral disc disorders with radiculopathy, lumbar region: Secondary | ICD-10-CM | POA: Diagnosis not present

## 2017-04-01 DIAGNOSIS — M5416 Radiculopathy, lumbar region: Secondary | ICD-10-CM | POA: Diagnosis not present

## 2017-04-01 DIAGNOSIS — Q762 Congenital spondylolisthesis: Secondary | ICD-10-CM

## 2017-04-01 MED ORDER — BETAMETHASONE SOD PHOS & ACET 6 (3-3) MG/ML IJ SUSP
12.0000 mg | Freq: Once | INTRAMUSCULAR | Status: AC
  Administered 2017-04-01: 12 mg

## 2017-04-01 MED ORDER — LIDOCAINE HCL (PF) 1 % IJ SOLN
2.0000 mL | Freq: Once | INTRAMUSCULAR | Status: AC
  Administered 2017-04-01: 2 mL

## 2017-04-01 MED ORDER — GABAPENTIN 400 MG PO CAPS: 400.0000 mg | ORAL_CAPSULE | Freq: Every day | ORAL | 0 refills | Status: AC

## 2017-04-01 NOTE — Progress Notes (Deleted)
Patient states he did well with the last injection. Has good days and bad days. Recently main pain is coming from the left knee. No longer having thigh pain.

## 2017-04-01 NOTE — Telephone Encounter (Signed)
Patient is requesting a refill on Gabapentin. Uses [facility] [address]

## 2017-04-01 NOTE — Telephone Encounter (Signed)
rx sent in 

## 2017-04-01 NOTE — Patient Instructions (Signed)

## 2017-04-01 NOTE — Progress Notes (Unsigned)
Fluoro Time: 37 sec Mgy: 36.17

## 2017-04-02 NOTE — Procedures (Signed)
Fred Stanley is a 83 year old gentleman that we saw through Dr. Erlinda Hong for her left hip and thigh pain which was severe. His pain was so severe that we could not really even place them on the table hardly for doing a spinal injection. MRI findings showed grade 2 listhesis due to spondylolysis of L5 on S1 small disc protrusion at L3-4. His pain pattern has been more anterior thigh than lateral or posterior. First injection didn't offer much relief this was at L5-S1. Second injection at L4 has provided significant relief for him. He comes in today ambulating without a cane he is walking upright he is doing much better. He says he has good bed days but he still having pain down to the left knee. He says his thigh pain is much improved. He says this was the worst pain is ever had. He still complains of left knee pain now worse with standing and ambulating. The question is whether this knee pain is coming from his spine at L4 or L3. I think the best approach is to repeat the L4 transforaminal injection since he still has some symptoms left over he did so extremely well with the injection. I'll also have him follow up with Dr. Erlinda Hong for evaluation of his left knee. He also want a refill of his gabapentin which she is taking at night. I did go ahead and repeat that. In the future would look at having his primary care physician do that.  Lumbosacral Transforaminal Epidural Steroid Injection - Sub-Pedicular Approach with Fluoroscopic Guidance  [name]      Date of Birth: 1928-12-18 MRN: [id number] PCP: Marletta Lor, MD      Visit Date: 04/01/2017   Universal Protocol:    Date/Time: 04/01/2017  Consent Given By: the patient  Position: PRONE  Additional Comments: Vital signs were monitored before and after the procedure. Patient was prepped and draped in the usual sterile fashion. The correct patient, procedure, and site was verified.   Injection Procedure Details:  Procedure Site One Meds  Administered:  Meds ordered this encounter  Medications  . lidocaine (PF) (XYLOCAINE) 1 % injection 2 mL  . betamethasone acetate-betamethasone sodium phosphate (CELESTONE) injection 12 mg  . gabapentin (NEURONTIN) 400 MG capsule    Sig: Take 1 capsule (400 mg total) by mouth at bedtime.    Dispense:  90 capsule    Refill:  0    Laterality: Left  Location/Site:  L4-L5  Needle size: 22 G  Needle type: Spinal  Needle Placement: Transforaminal  Findings:  -Contrast Used: 0.5 mL iohexol 180 mg iodine/mL   -Comments: Excellent flow of contrast along the nerve and into the epidural space.  Procedure Details: After squaring off the end-plates to get a true AP view, the C-arm was positioned so that an oblique view of the foramen as noted above was visualized. The target area is just inferior to the "nose of the scotty dog" or sub pedicular. The soft tissues overlying this structure were infiltrated with 2-3 ml. of 1% Lidocaine without Epinephrine.  The spinal needle was inserted toward the target using a "trajectory" view along the fluoroscope beam.  Under AP and lateral visualization, the needle was advanced so it did not puncture dura and was located close the 6 O'Clock position of the pedical in AP tracterory. Biplanar projections were used to confirm position. Aspiration was confirmed to be negative for CSF and/or blood. A 1-2 ml. volume of Isovue-250 was injected and flow of contrast  was noted at each level. Radiographs were obtained for documentation purposes.   After attaining the desired flow of contrast documented above, a 0.5 to 1.0 ml test dose of 0.25% Marcaine was injected into each respective transforaminal space.  The patient was observed for 90 seconds post injection.  After no sensory deficits were reported, and normal lower extremity motor function was noted,   the above injectate was administered so that equal amounts of the injectate were placed at each foramen (level) into  the transforaminal epidural space.   Additional Comments:  The patient tolerated the procedure well Dressing: Band-Aid    Post-procedure details: Patient was observed during the procedure. Post-procedure instructions were reviewed.  Patient left the clinic in stable condition.

## 2017-04-22 ENCOUNTER — Other Ambulatory Visit: Payer: Self-pay | Admitting: Internal Medicine

## 2017-05-01 DIAGNOSIS — M5126 Other intervertebral disc displacement, lumbar region: Secondary | ICD-10-CM | POA: Diagnosis not present

## 2017-05-01 DIAGNOSIS — M4300 Spondylolysis, site unspecified: Secondary | ICD-10-CM | POA: Diagnosis not present

## 2017-05-01 DIAGNOSIS — M5416 Radiculopathy, lumbar region: Secondary | ICD-10-CM | POA: Diagnosis not present

## 2017-05-01 DIAGNOSIS — M4317 Spondylolisthesis, lumbosacral region: Secondary | ICD-10-CM | POA: Diagnosis not present

## 2017-05-02 ENCOUNTER — Ambulatory Visit (INDEPENDENT_AMBULATORY_CARE_PROVIDER_SITE_OTHER): Payer: PPO | Admitting: Orthopaedic Surgery

## 2017-05-16 ENCOUNTER — Encounter: Payer: Self-pay | Admitting: Internal Medicine

## 2017-05-17 DIAGNOSIS — M4726 Other spondylosis with radiculopathy, lumbar region: Secondary | ICD-10-CM | POA: Diagnosis not present

## 2017-05-17 DIAGNOSIS — M48062 Spinal stenosis, lumbar region with neurogenic claudication: Secondary | ICD-10-CM | POA: Diagnosis not present

## 2017-05-17 DIAGNOSIS — M5116 Intervertebral disc disorders with radiculopathy, lumbar region: Secondary | ICD-10-CM | POA: Diagnosis not present

## 2017-05-17 DIAGNOSIS — M5126 Other intervertebral disc displacement, lumbar region: Secondary | ICD-10-CM | POA: Diagnosis not present

## 2017-07-23 DIAGNOSIS — D225 Melanocytic nevi of trunk: Secondary | ICD-10-CM | POA: Diagnosis not present

## 2017-07-23 DIAGNOSIS — L820 Inflamed seborrheic keratosis: Secondary | ICD-10-CM | POA: Diagnosis not present

## 2017-07-23 DIAGNOSIS — L570 Actinic keratosis: Secondary | ICD-10-CM | POA: Diagnosis not present

## 2017-07-23 DIAGNOSIS — L814 Other melanin hyperpigmentation: Secondary | ICD-10-CM | POA: Diagnosis not present

## 2017-07-23 DIAGNOSIS — Z23 Encounter for immunization: Secondary | ICD-10-CM | POA: Diagnosis not present

## 2017-07-23 DIAGNOSIS — D1801 Hemangioma of skin and subcutaneous tissue: Secondary | ICD-10-CM | POA: Diagnosis not present

## 2017-07-23 DIAGNOSIS — L821 Other seborrheic keratosis: Secondary | ICD-10-CM | POA: Diagnosis not present

## 2017-07-23 DIAGNOSIS — Z85828 Personal history of other malignant neoplasm of skin: Secondary | ICD-10-CM | POA: Diagnosis not present

## 2017-09-09 DIAGNOSIS — M9901 Segmental and somatic dysfunction of cervical region: Secondary | ICD-10-CM | POA: Diagnosis not present

## 2017-09-09 DIAGNOSIS — M47812 Spondylosis without myelopathy or radiculopathy, cervical region: Secondary | ICD-10-CM | POA: Diagnosis not present

## 2017-09-10 DIAGNOSIS — M47812 Spondylosis without myelopathy or radiculopathy, cervical region: Secondary | ICD-10-CM | POA: Diagnosis not present

## 2017-09-10 DIAGNOSIS — M9901 Segmental and somatic dysfunction of cervical region: Secondary | ICD-10-CM | POA: Diagnosis not present

## 2017-09-11 DIAGNOSIS — M47812 Spondylosis without myelopathy or radiculopathy, cervical region: Secondary | ICD-10-CM | POA: Diagnosis not present

## 2017-09-11 DIAGNOSIS — M9901 Segmental and somatic dysfunction of cervical region: Secondary | ICD-10-CM | POA: Diagnosis not present

## 2017-09-12 DIAGNOSIS — M9901 Segmental and somatic dysfunction of cervical region: Secondary | ICD-10-CM | POA: Diagnosis not present

## 2017-09-12 DIAGNOSIS — M47812 Spondylosis without myelopathy or radiculopathy, cervical region: Secondary | ICD-10-CM | POA: Diagnosis not present

## 2017-09-16 DIAGNOSIS — M47812 Spondylosis without myelopathy or radiculopathy, cervical region: Secondary | ICD-10-CM | POA: Diagnosis not present

## 2017-09-16 DIAGNOSIS — M9901 Segmental and somatic dysfunction of cervical region: Secondary | ICD-10-CM | POA: Diagnosis not present

## 2017-09-18 DIAGNOSIS — M47812 Spondylosis without myelopathy or radiculopathy, cervical region: Secondary | ICD-10-CM | POA: Diagnosis not present

## 2017-09-18 DIAGNOSIS — M9901 Segmental and somatic dysfunction of cervical region: Secondary | ICD-10-CM | POA: Diagnosis not present

## 2017-09-23 DIAGNOSIS — M9901 Segmental and somatic dysfunction of cervical region: Secondary | ICD-10-CM | POA: Diagnosis not present

## 2017-09-23 DIAGNOSIS — M47812 Spondylosis without myelopathy or radiculopathy, cervical region: Secondary | ICD-10-CM | POA: Diagnosis not present

## 2017-09-25 DIAGNOSIS — M47812 Spondylosis without myelopathy or radiculopathy, cervical region: Secondary | ICD-10-CM | POA: Diagnosis not present

## 2017-09-25 DIAGNOSIS — M9901 Segmental and somatic dysfunction of cervical region: Secondary | ICD-10-CM | POA: Diagnosis not present

## 2017-09-30 DIAGNOSIS — M9901 Segmental and somatic dysfunction of cervical region: Secondary | ICD-10-CM | POA: Diagnosis not present

## 2017-09-30 DIAGNOSIS — M47812 Spondylosis without myelopathy or radiculopathy, cervical region: Secondary | ICD-10-CM | POA: Diagnosis not present

## 2017-10-02 DIAGNOSIS — M9901 Segmental and somatic dysfunction of cervical region: Secondary | ICD-10-CM | POA: Diagnosis not present

## 2017-10-02 DIAGNOSIS — M47812 Spondylosis without myelopathy or radiculopathy, cervical region: Secondary | ICD-10-CM | POA: Diagnosis not present

## 2017-10-07 DIAGNOSIS — M47812 Spondylosis without myelopathy or radiculopathy, cervical region: Secondary | ICD-10-CM | POA: Diagnosis not present

## 2017-10-07 DIAGNOSIS — M9901 Segmental and somatic dysfunction of cervical region: Secondary | ICD-10-CM | POA: Diagnosis not present

## 2017-10-09 DIAGNOSIS — M47812 Spondylosis without myelopathy or radiculopathy, cervical region: Secondary | ICD-10-CM | POA: Diagnosis not present

## 2017-10-09 DIAGNOSIS — M9901 Segmental and somatic dysfunction of cervical region: Secondary | ICD-10-CM | POA: Diagnosis not present

## 2017-10-14 DIAGNOSIS — M47812 Spondylosis without myelopathy or radiculopathy, cervical region: Secondary | ICD-10-CM | POA: Diagnosis not present

## 2017-10-14 DIAGNOSIS — M9901 Segmental and somatic dysfunction of cervical region: Secondary | ICD-10-CM | POA: Diagnosis not present

## 2017-10-23 DIAGNOSIS — L570 Actinic keratosis: Secondary | ICD-10-CM | POA: Diagnosis not present

## 2017-10-23 DIAGNOSIS — D225 Melanocytic nevi of trunk: Secondary | ICD-10-CM | POA: Diagnosis not present

## 2017-11-05 ENCOUNTER — Other Ambulatory Visit: Payer: Self-pay | Admitting: Internal Medicine

## 2017-11-11 NOTE — Telephone Encounter (Signed)
Left message for patient to schedule an appointment.

## 2018-06-18 IMAGING — MR MR LUMBAR SPINE W/O CM
4 of 5 series · 18 of 48 positions shown · non-contrast
Comparison: None.

CLINICAL DATA: Severe left-sided low back pain extending into the
left leg for 3 weeks. No known injury or prior relevant surgery.

EXAM:
MRI LUMBAR SPINE WITHOUT CONTRAST
TECHNIQUE: Multiplanar, multisequence MR imaging of the lumbar spine was
performed. No intravenous contrast was administered.

[Series 6: T2 · sagittal · 4.0mm · 0.73mm/px · 7 of 15 slices shown (1 of 2)]
[im 1/15]
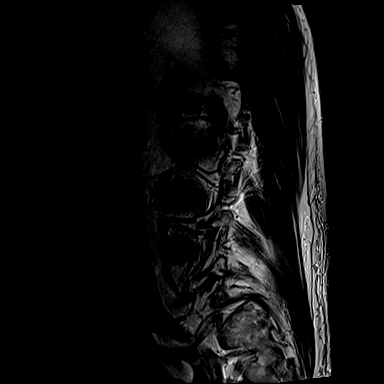
[im 3/15]
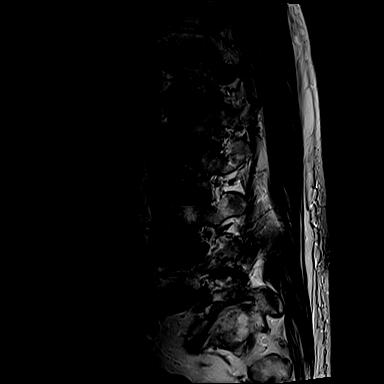
[im 5/15]
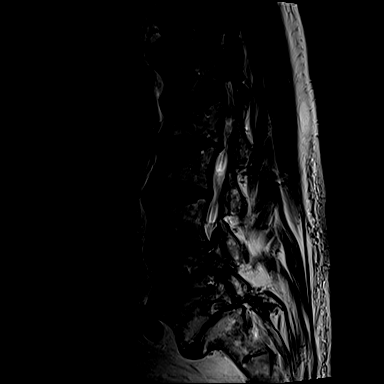
[im 8/15]
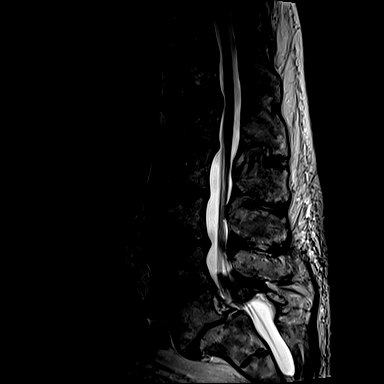
[im 10/15]
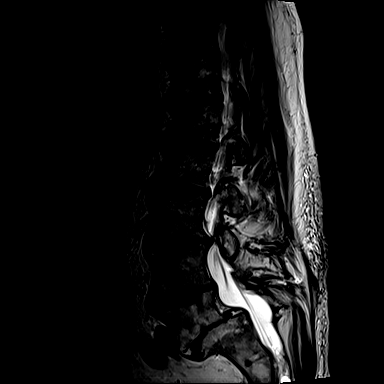
[im 12/15]
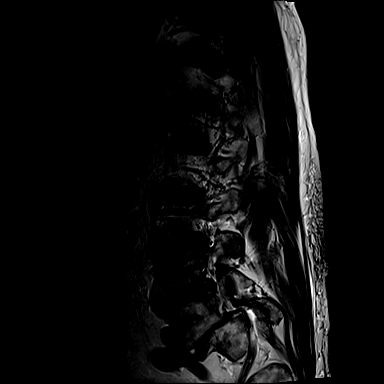
[im 15/15]
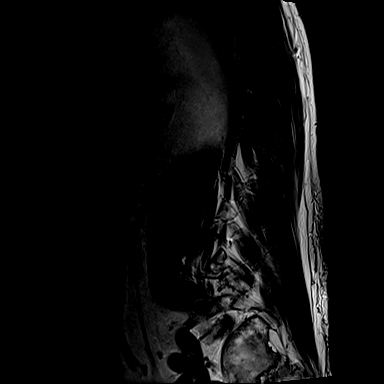

[Series 7: T1 · sagittal · 4.0mm · 0.73mm/px · 3 of 15 slices shown (1 of 2)]
[im 3/15]
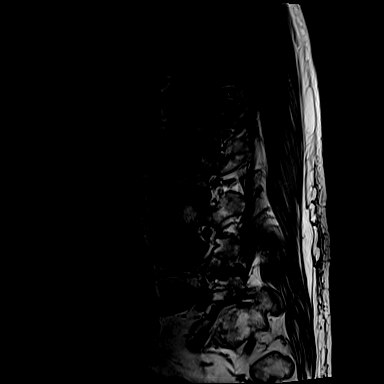
[im 8/15]
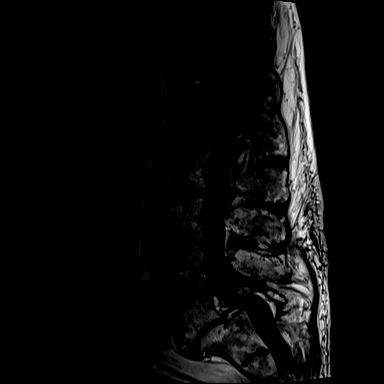
[im 12/15]
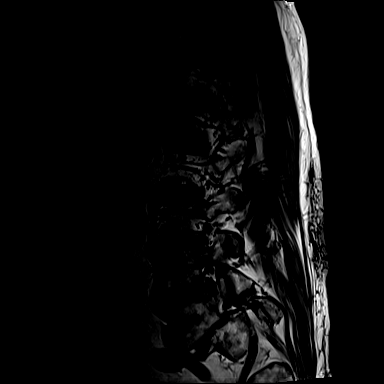

[Series 13: T2 · axial · 4.0mm · 0.28mm/px · z∈[-109,+47]mm · 5 of 34 slices shown (2 of 2)]
[im 1/34]
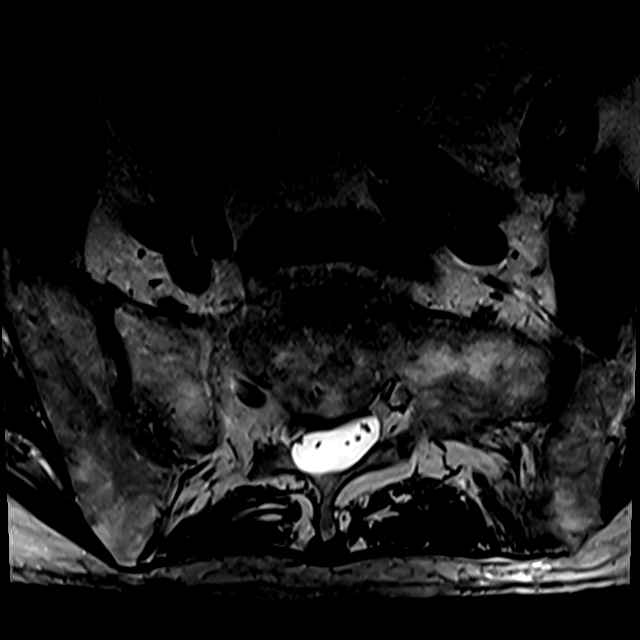
[im 6/34]
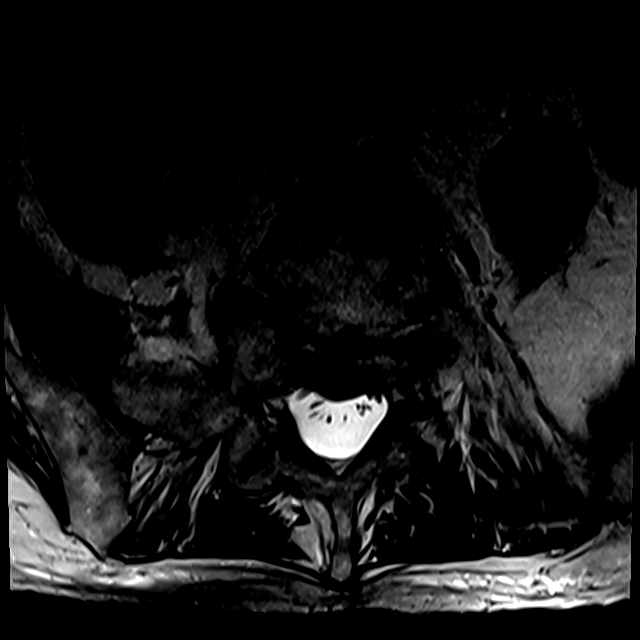
[im 11/34]
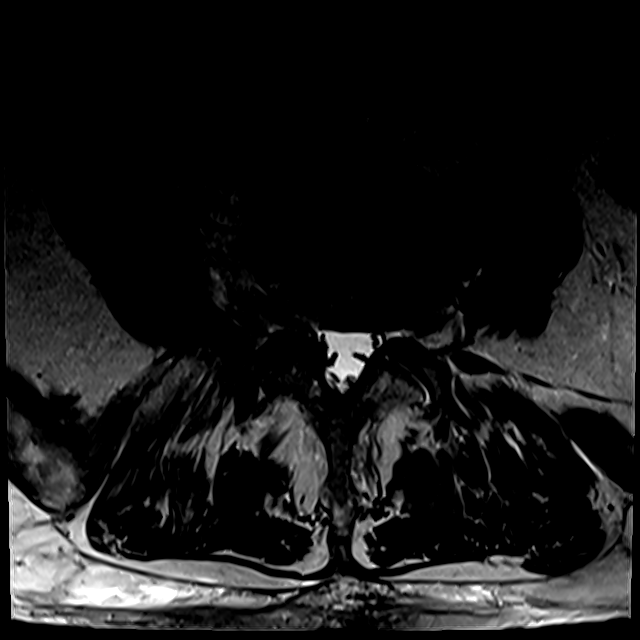
[im 18/34]
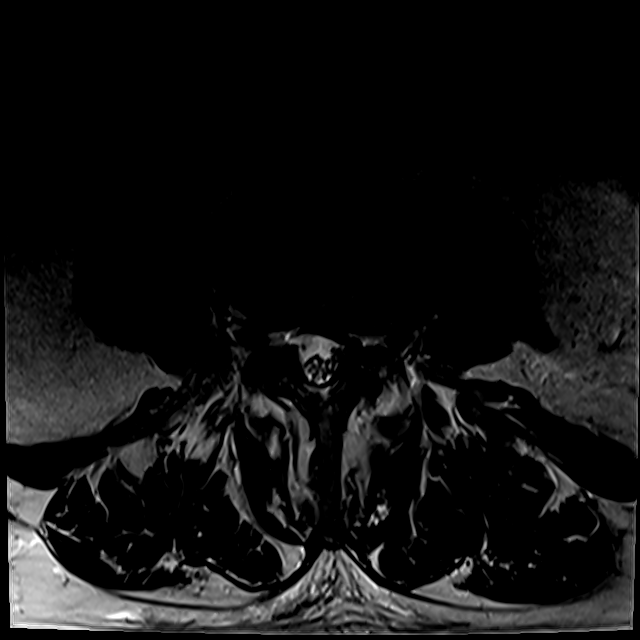
[im 28/34]
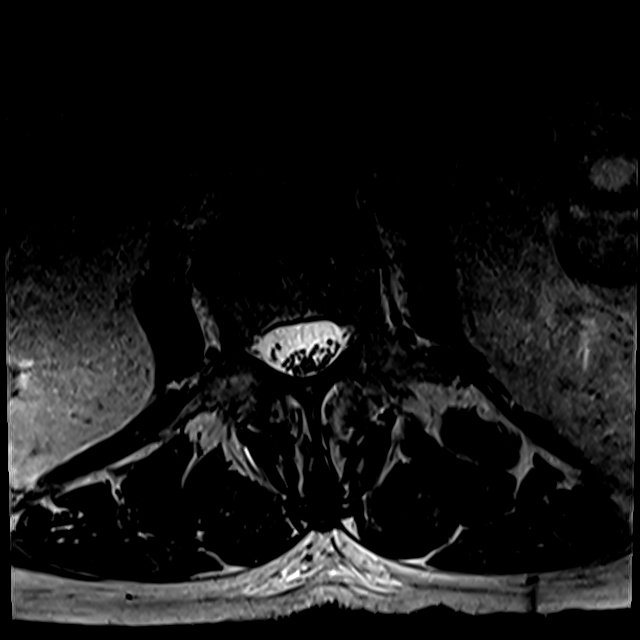

[Series 100: T1 · axial · 4.0mm · 0.28mm/px · z∈[-85,+47]mm · 3 of 34 slices shown (2 of 2)]
[im 6/34]
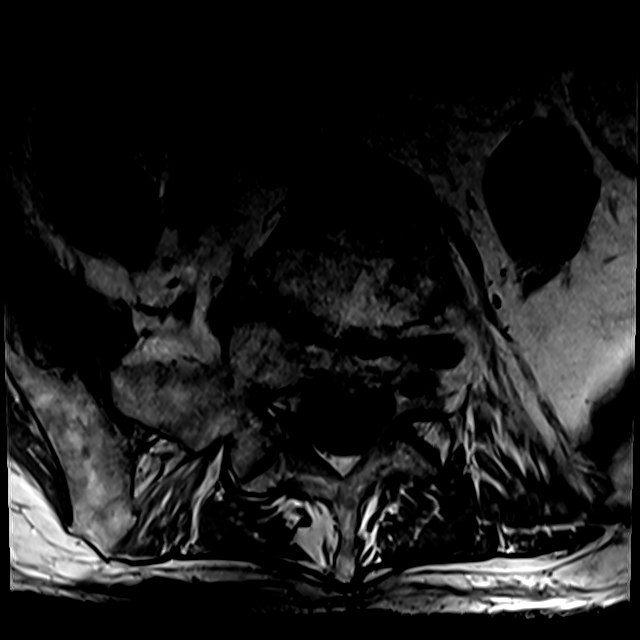
[im 18/34]
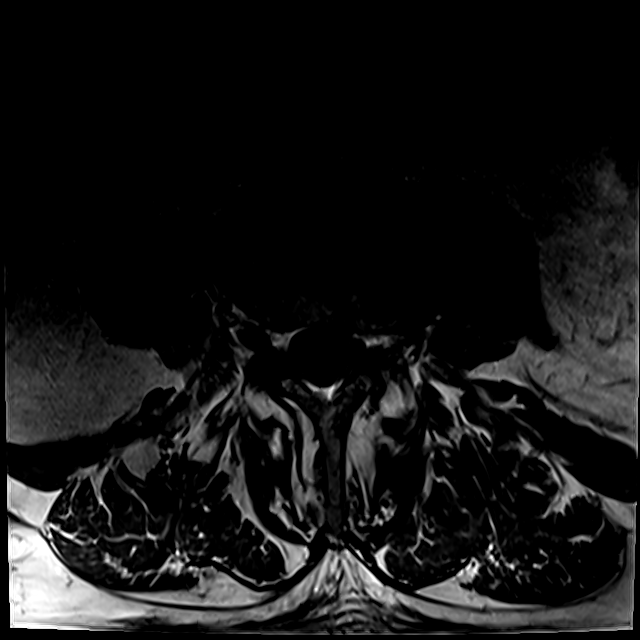
[im 28/34]
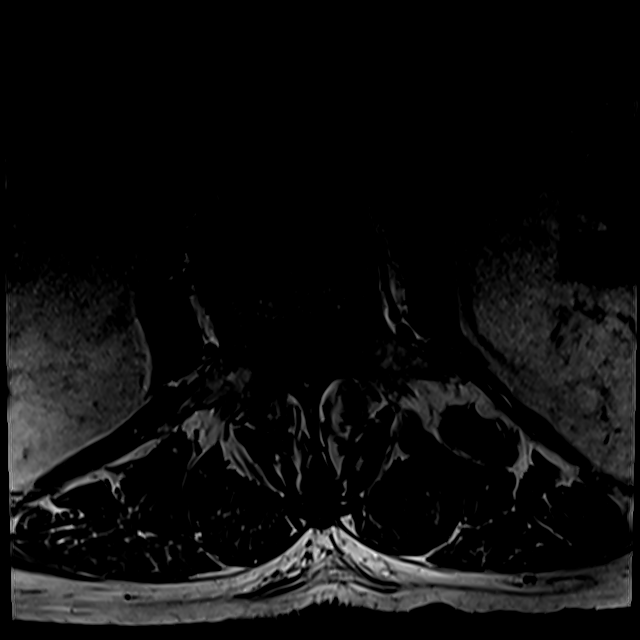

[18 of 48 positions shown; findings below may reference images not displayed]

FINDINGS: Segmentation: Conventional anatomy assumed, with the last open disc
space designated L5-S1.

Alignment: There is 17 mm of anterolisthesis at L5-S1 secondary to
bilateral L5 pars defects. The alignment is otherwise near anatomic.

Vertebrae: As above, there are bilateral L5 pars defects with
associated endplate degenerative changes at L5-S1. Chronic Schmorl's
nodes are present at multiple levels, with mild loss of height
greatest at L1. No acute or worrisome osseous findings are seen. The
visualized sacroiliac joints appear unremarkable.

Conus medullaris: Extends to the L2-3 level and appears normal.

Paraspinal and other soft tissues: No significant paraspinal
findings.

Disc levels:

L1-2: Mild disc bulging and endplate osteophytes. No significant
spinal stenosis or nerve root encroachment.

L2-3: Disc height and hydration are relatively preserved. There is
mild disc bulging and facet hypertrophy. No significant spinal
stenosis or nerve root encroachment.

L3-4: Annular disc bulging is eccentric to the left. There is
moderate facet and ligamentous hypertrophy. These factors contribute
to mild spinal stenosis with mild narrowing of the lateral recesses,
left greater than right. The foramina appear sufficiently patent.

L4-5: Loss of disc height with annular disc bulging and endplate
osteophytes asymmetric to the right. Moderate facet and ligamentous
hypertrophy. The central spinal canal and lateral recesses are
adequately patent. There is mild foraminal narrowing on the right.

L5-S1: As above, there is a grade 2 anterolisthesis secondary to
bilateral L5 pars defects. There is advanced loss of disc height
with endplate osteophytes and moderate foraminal narrowing
bilaterally. Bilateral L5 nerve root encroachment likely.
IMPRESSION: 1. Chronic bilateral L5 pars defects with grade 2 anterolisthesis
and moderate foraminal narrowing bilaterally at L5-S1. Bilateral L5
nerve root encroachment likely.
2. Mild right foraminal narrowing at L4-5.
3. Mild multifactorial spinal stenosis at L3-4 with narrowing of the
lateral recesses, left greater than right.
